# Patient Record
Sex: Female | Born: 1975 | Hispanic: No | Marital: Married | Smoking: Former smoker
Health system: Southern US, Community
[De-identification: ages and names within clinical notes are randomized; demographics above are authoritative.]

## PROBLEM LIST (undated history)

## (undated) DIAGNOSIS — F319 Bipolar disorder, unspecified: Secondary | ICD-10-CM

## (undated) DIAGNOSIS — F429 Obsessive-compulsive disorder, unspecified: Secondary | ICD-10-CM

## (undated) DIAGNOSIS — N939 Abnormal uterine and vaginal bleeding, unspecified: Secondary | ICD-10-CM

## (undated) DIAGNOSIS — L740 Miliaria rubra: Secondary | ICD-10-CM

## (undated) DIAGNOSIS — F909 Attention-deficit hyperactivity disorder, unspecified type: Secondary | ICD-10-CM

## (undated) DIAGNOSIS — R519 Headache, unspecified: Secondary | ICD-10-CM

## (undated) DIAGNOSIS — K219 Gastro-esophageal reflux disease without esophagitis: Secondary | ICD-10-CM

## (undated) HISTORY — PX: HEMORRHOID SURGERY: SHX153

---

## 2013-08-02 ENCOUNTER — Other Ambulatory Visit (HOSPITAL_COMMUNITY)
Admission: RE | Admit: 2013-08-02 | Discharge: 2013-08-02 | Disposition: A | Discharge status: Home / Self Care | Payer: 59 | Source: Ambulatory Visit | Attending: Internal Medicine | Admitting: Internal Medicine

## 2013-08-02 DIAGNOSIS — Z01419 Encounter for gynecological examination (general) (routine) without abnormal findings: Secondary | ICD-10-CM | POA: Insufficient documentation

## 2014-12-14 ENCOUNTER — Other Ambulatory Visit (HOSPITAL_COMMUNITY)
Admission: RE | Admit: 2014-12-14 | Discharge: 2014-12-14 | Disposition: A | Discharge status: Home / Self Care | Payer: Self-pay | Source: Ambulatory Visit | Attending: Internal Medicine | Admitting: Internal Medicine

## 2014-12-14 DIAGNOSIS — Z01419 Encounter for gynecological examination (general) (routine) without abnormal findings: Secondary | ICD-10-CM | POA: Insufficient documentation

## 2015-12-23 ENCOUNTER — Emergency Department (HOSPITAL_COMMUNITY): Payer: Managed Care, Other (non HMO)

## 2015-12-23 ENCOUNTER — Encounter (HOSPITAL_COMMUNITY)
Admission: EM | Disposition: A | Discharge status: Home / Self Care | Payer: Self-pay | Source: Home / Self Care | Attending: Emergency Medicine

## 2015-12-23 ENCOUNTER — Encounter (HOSPITAL_COMMUNITY): Payer: Self-pay | Admitting: Emergency Medicine

## 2015-12-23 ENCOUNTER — Observation Stay (HOSPITAL_COMMUNITY)
Admission: EM | Admit: 2015-12-23 | Discharge: 2015-12-25 | Disposition: A | Discharge status: Home / Self Care | Payer: Managed Care, Other (non HMO) | Attending: Surgery | Admitting: Surgery

## 2015-12-23 ENCOUNTER — Observation Stay (HOSPITAL_COMMUNITY): Payer: Managed Care, Other (non HMO) | Admitting: Certified Registered"

## 2015-12-23 DIAGNOSIS — K3580 Unspecified acute appendicitis: Secondary | ICD-10-CM

## 2015-12-23 DIAGNOSIS — Z79899 Other long term (current) drug therapy: Secondary | ICD-10-CM | POA: Diagnosis not present

## 2015-12-23 DIAGNOSIS — F319 Bipolar disorder, unspecified: Secondary | ICD-10-CM | POA: Diagnosis not present

## 2015-12-23 DIAGNOSIS — K353 Acute appendicitis with localized peritonitis: Principal | ICD-10-CM | POA: Insufficient documentation

## 2015-12-23 DIAGNOSIS — Z87891 Personal history of nicotine dependence: Secondary | ICD-10-CM | POA: Diagnosis not present

## 2015-12-23 DIAGNOSIS — K37 Unspecified appendicitis: Secondary | ICD-10-CM | POA: Diagnosis present

## 2015-12-23 DIAGNOSIS — R1031 Right lower quadrant pain: Secondary | ICD-10-CM | POA: Diagnosis present

## 2015-12-23 HISTORY — PX: LAPAROSCOPIC APPENDECTOMY: SHX408

## 2015-12-23 HISTORY — DX: Bipolar disorder, unspecified: F31.9

## 2015-12-23 LAB — COMPREHENSIVE METABOLIC PANEL
ALBUMIN: 4.9 g/dL (ref 3.5–5.0)
ALK PHOS: 73 U/L (ref 38–126)
ALT: 20 U/L (ref 14–54)
ANION GAP: 11 (ref 5–15)
AST: 21 U/L (ref 15–41)
BILIRUBIN TOTAL: 0.5 mg/dL (ref 0.3–1.2)
BUN: 8 mg/dL (ref 6–20)
CALCIUM: 9.4 mg/dL (ref 8.9–10.3)
CO2: 24 mmol/L (ref 22–32)
CREATININE: 0.89 mg/dL (ref 0.44–1.00)
Chloride: 104 mmol/L (ref 101–111)
GFR calc Af Amer: >60 mL/min (ref >60)
GFR calc non Af Amer: >60 mL/min (ref >60)
GLUCOSE: 157 mg/dL — AB (ref 65–99)
Potassium: 3.9 mmol/L (ref 3.5–5.1)
SODIUM: 139 mmol/L (ref 135–145)
TOTAL PROTEIN: 8.2 g/dL — AB (ref 6.5–8.1)

## 2015-12-23 LAB — URINALYSIS, ROUTINE W REFLEX MICROSCOPIC
BILIRUBIN URINE: NEGATIVE
Glucose, UA: NEGATIVE mg/dL
Leukocytes, UA: NEGATIVE
NITRITE: NEGATIVE
Protein, ur: NEGATIVE mg/dL
SPECIFIC GRAVITY, URINE: 1.029 (ref 1.005–1.030)
pH: 6 (ref 5.0–8.0)

## 2015-12-23 LAB — CBC
HCT: 42 % (ref 36.0–46.0)
Hemoglobin: 13.8 g/dL (ref 12.0–15.0)
MCH: 31 pg (ref 26.0–34.0)
MCHC: 32.9 g/dL (ref 30.0–36.0)
MCV: 94.4 fL (ref 78.0–100.0)
Platelets: 303 10*3/uL (ref 150–400)
RBC: 4.45 MIL/uL (ref 3.87–5.11)
RDW: 13.5 % (ref 11.5–15.5)
WBC: 14.8 10*3/uL — ABNORMAL HIGH (ref 4.0–10.5)

## 2015-12-23 LAB — I-STAT BETA HCG BLOOD, ED (MC, WL, AP ONLY)

## 2015-12-23 LAB — URINE MICROSCOPIC-ADD ON

## 2015-12-23 LAB — LIPASE, BLOOD: Lipase: 23 U/L (ref 11–51)

## 2015-12-23 SURGERY — APPENDECTOMY, LAPAROSCOPIC
Anesthesia: General | Site: Abdomen

## 2015-12-23 MED ORDER — MIDAZOLAM HCL 5 MG/5ML IJ SOLN
INTRAMUSCULAR | Status: DC | PRN
  Administered 2015-12-23: 2 mg via INTRAVENOUS

## 2015-12-23 MED ORDER — HEPARIN SODIUM (PORCINE) 5000 UNIT/ML IJ SOLN
5000.0000 [IU] | Freq: Three times a day (TID) | INTRAMUSCULAR | Status: DC
  Administered 2015-12-24 – 2015-12-25 (×4): 5000 [IU] via SUBCUTANEOUS
  Filled 2015-12-23 (×7): qty 1

## 2015-12-23 MED ORDER — PROPOFOL 10 MG/ML IV BOLUS
INTRAVENOUS | Status: DC | PRN
Start: 2015-12-23 — End: 2015-12-23
  Administered 2015-12-23: 200 mg via INTRAVENOUS

## 2015-12-23 MED ORDER — BUPIVACAINE-EPINEPHRINE 0.25% -1:200000 IJ SOLN
INTRAMUSCULAR | Status: DC | PRN
  Administered 2015-12-23: 20 mL

## 2015-12-23 MED ORDER — FENTANYL CITRATE (PF) 250 MCG/5ML IJ SOLN
INTRAMUSCULAR | Status: AC
  Filled 2015-12-23: qty 5

## 2015-12-23 MED ORDER — NEOSTIGMINE METHYLSULFATE 10 MG/10ML IV SOLN
INTRAVENOUS | Status: DC | PRN
  Administered 2015-12-23: 3 mg via INTRAVENOUS

## 2015-12-23 MED ORDER — 0.9 % SODIUM CHLORIDE (POUR BTL) OPTIME
TOPICAL | Status: DC | PRN
  Administered 2015-12-23: 1000 mL

## 2015-12-23 MED ORDER — LACTATED RINGERS IV SOLN: INTRAVENOUS | Status: DC

## 2015-12-23 MED ORDER — ROCURONIUM BROMIDE 100 MG/10ML IV SOLN
INTRAVENOUS | Status: AC
  Filled 2015-12-23: qty 1

## 2015-12-23 MED ORDER — GLYCOPYRROLATE 0.2 MG/ML IJ SOLN
INTRAMUSCULAR | Status: AC
  Filled 2015-12-23: qty 2

## 2015-12-23 MED ORDER — MORPHINE SULFATE (PF) 2 MG/ML IV SOLN
1.0000 mg | INTRAVENOUS | Status: DC | PRN
  Administered 2015-12-23 (×2): 4 mg via INTRAVENOUS
  Filled 2015-12-23 (×2): qty 2

## 2015-12-23 MED ORDER — ROCURONIUM BROMIDE 100 MG/10ML IV SOLN
INTRAVENOUS | Status: DC | PRN
  Administered 2015-12-23: 40 mg via INTRAVENOUS

## 2015-12-23 MED ORDER — ONDANSETRON HCL 4 MG/2ML IJ SOLN
4.0000 mg | Freq: Once | INTRAMUSCULAR | Status: AC
  Administered 2015-12-23: 4 mg via INTRAVENOUS
  Filled 2015-12-23: qty 2

## 2015-12-23 MED ORDER — MORPHINE SULFATE (PF) 4 MG/ML IV SOLN
4.0000 mg | Freq: Once | INTRAVENOUS | Status: AC
  Administered 2015-12-23: 4 mg via INTRAVENOUS
  Filled 2015-12-23: qty 1

## 2015-12-23 MED ORDER — LACTATED RINGERS IR SOLN
Status: DC | PRN
  Administered 2015-12-23: 3000 mL

## 2015-12-23 MED ORDER — INFLUENZA VAC SPLIT QUAD 0.5 ML IM SUSY
0.5000 mL | PREFILLED_SYRINGE | INTRAMUSCULAR | Status: DC
  Filled 2015-12-23 (×2): qty 0.5

## 2015-12-23 MED ORDER — IBUPROFEN 200 MG PO TABS
600.0000 mg | ORAL_TABLET | Freq: Four times a day (QID) | ORAL | Status: DC | PRN
  Administered 2015-12-25: 600 mg via ORAL
  Filled 2015-12-23: qty 3

## 2015-12-23 MED ORDER — DEXAMETHASONE SODIUM PHOSPHATE 10 MG/ML IJ SOLN
INTRAMUSCULAR | Status: AC
Start: 2015-12-23 — End: 2015-12-23
  Filled 2015-12-23: qty 1

## 2015-12-23 MED ORDER — DEXTROSE 5 % IV SOLN
2.0000 g | INTRAVENOUS | Status: DC
  Administered 2015-12-23: 2 g via INTRAVENOUS
  Filled 2015-12-23: qty 2

## 2015-12-23 MED ORDER — PROMETHAZINE HCL 25 MG/ML IJ SOLN: 6.2500 mg | INTRAMUSCULAR | Status: DC | PRN

## 2015-12-23 MED ORDER — HYDROMORPHONE HCL 1 MG/ML IJ SOLN: 0.2500 mg | INTRAMUSCULAR | Status: DC | PRN

## 2015-12-23 MED ORDER — MIDAZOLAM HCL 2 MG/2ML IJ SOLN
INTRAMUSCULAR | Status: AC
  Filled 2015-12-23: qty 2

## 2015-12-23 MED ORDER — KCL IN DEXTROSE-NACL 20-5-0.45 MEQ/L-%-% IV SOLN
INTRAVENOUS | Status: DC
  Administered 2015-12-24: via INTRAVENOUS
  Filled 2015-12-23 (×3): qty 1000

## 2015-12-23 MED ORDER — KCL IN DEXTROSE-NACL 20-5-0.9 MEQ/L-%-% IV SOLN: INTRAVENOUS | Status: DC

## 2015-12-23 MED ORDER — QUETIAPINE FUMARATE ER 300 MG PO TB24
300.0000 mg | ORAL_TABLET | Freq: Every day | ORAL | Status: DC
  Administered 2015-12-24 (×2): 300 mg via ORAL
  Filled 2015-12-23 (×3): qty 1

## 2015-12-23 MED ORDER — METRONIDAZOLE IN NACL 5-0.79 MG/ML-% IV SOLN
500.0000 mg | Freq: Three times a day (TID) | INTRAVENOUS | Status: DC
  Administered 2015-12-23 – 2015-12-24 (×2): 500 mg via INTRAVENOUS
  Filled 2015-12-23 (×3): qty 100

## 2015-12-23 MED ORDER — IOHEXOL 300 MG/ML  SOLN
25.0000 mL | Freq: Once | INTRAMUSCULAR | Status: AC | PRN
  Administered 2015-12-23: 25 mL via ORAL

## 2015-12-23 MED ORDER — ONDANSETRON HCL 4 MG/2ML IJ SOLN
INTRAMUSCULAR | Status: DC | PRN
  Administered 2015-12-23: 4 mg via INTRAVENOUS

## 2015-12-23 MED ORDER — PHENYLEPHRINE 40 MCG/ML (10ML) SYRINGE FOR IV PUSH (FOR BLOOD PRESSURE SUPPORT)
PREFILLED_SYRINGE | INTRAVENOUS | Status: AC
  Filled 2015-12-23: qty 10

## 2015-12-23 MED ORDER — IOHEXOL 300 MG/ML  SOLN
100.0000 mL | Freq: Once | INTRAMUSCULAR | Status: AC | PRN
  Administered 2015-12-23: 100 mL via INTRAVENOUS

## 2015-12-23 MED ORDER — BUPROPION HCL ER (XL) 300 MG PO TB24
300.0000 mg | ORAL_TABLET | Freq: Every morning | ORAL | Status: DC
  Administered 2015-12-24 – 2015-12-25 (×2): 300 mg via ORAL
  Filled 2015-12-23 (×2): qty 1

## 2015-12-23 MED ORDER — HYDROCODONE-ACETAMINOPHEN 5-325 MG PO TABS
1.0000 | ORAL_TABLET | ORAL | Status: DC | PRN
  Administered 2015-12-24 (×2): 2 via ORAL
  Administered 2015-12-24: 1 via ORAL
  Administered 2015-12-24: 2 via ORAL
  Administered 2015-12-25: 1 via ORAL
  Filled 2015-12-23 (×3): qty 2
  Filled 2015-12-23 (×2): qty 1

## 2015-12-23 MED ORDER — ONDANSETRON 4 MG PO TBDP: 4.0000 mg | ORAL_TABLET | Freq: Four times a day (QID) | ORAL | Status: DC | PRN

## 2015-12-23 MED ORDER — FLUOXETINE HCL 20 MG PO CAPS
20.0000 mg | ORAL_CAPSULE | Freq: Every morning | ORAL | Status: DC
  Administered 2015-12-24 – 2015-12-25 (×2): 20 mg via ORAL
  Filled 2015-12-23 (×2): qty 1

## 2015-12-23 MED ORDER — ONDANSETRON HCL 4 MG/2ML IJ SOLN
INTRAMUSCULAR | Status: AC
  Filled 2015-12-23: qty 2

## 2015-12-23 MED ORDER — PROPOFOL 10 MG/ML IV BOLUS
INTRAVENOUS | Status: AC
  Filled 2015-12-23: qty 20

## 2015-12-23 MED ORDER — NEOSTIGMINE METHYLSULFATE 10 MG/10ML IV SOLN
INTRAVENOUS | Status: AC
  Filled 2015-12-23: qty 1

## 2015-12-23 MED ORDER — LIDOCAINE HCL (PF) 2 % IJ SOLN
INTRAMUSCULAR | Status: DC | PRN
  Administered 2015-12-23: 30 mg via INTRADERMAL

## 2015-12-23 MED ORDER — GLYCOPYRROLATE 0.2 MG/ML IJ SOLN
INTRAMUSCULAR | Status: DC | PRN
  Administered 2015-12-23: 0.4 mg via INTRAVENOUS

## 2015-12-23 MED ORDER — BUPIVACAINE-EPINEPHRINE (PF) 0.25% -1:200000 IJ SOLN
INTRAMUSCULAR | Status: AC
  Filled 2015-12-23: qty 30

## 2015-12-23 MED ORDER — DEXAMETHASONE SODIUM PHOSPHATE 10 MG/ML IJ SOLN
INTRAMUSCULAR | Status: DC | PRN
  Administered 2015-12-23: 10 mg via INTRAVENOUS

## 2015-12-23 MED ORDER — ONDANSETRON HCL 4 MG/2ML IJ SOLN
4.0000 mg | Freq: Four times a day (QID) | INTRAMUSCULAR | Status: DC | PRN
  Administered 2015-12-23: 4 mg via INTRAVENOUS
  Filled 2015-12-23: qty 2

## 2015-12-23 MED ORDER — SODIUM CHLORIDE 0.9 % IV BOLUS (SEPSIS)
1000.0000 mL | Freq: Once | INTRAVENOUS | Status: AC
  Administered 2015-12-23: 1000 mL via INTRAVENOUS

## 2015-12-23 MED ORDER — MORPHINE SULFATE (PF) 2 MG/ML IV SOLN
1.0000 mg | INTRAVENOUS | Status: DC | PRN
  Administered 2015-12-24: 2 mg via INTRAVENOUS
  Administered 2015-12-24: 1 mg via INTRAVENOUS
  Administered 2015-12-24: 2 mg via INTRAVENOUS
  Filled 2015-12-23 (×3): qty 1

## 2015-12-23 MED ORDER — ONDANSETRON HCL 4 MG/2ML IJ SOLN: 4.0000 mg | Freq: Four times a day (QID) | INTRAMUSCULAR | Status: DC | PRN

## 2015-12-23 MED ORDER — LACTATED RINGERS IV SOLN
INTRAVENOUS | Status: DC | PRN
  Administered 2015-12-23: 21:00:00 via INTRAVENOUS

## 2015-12-23 MED ORDER — FENTANYL CITRATE (PF) 100 MCG/2ML IJ SOLN
INTRAMUSCULAR | Status: DC | PRN
  Administered 2015-12-23 (×5): 50 ug via INTRAVENOUS

## 2015-12-23 MED ORDER — SUCCINYLCHOLINE CHLORIDE 20 MG/ML IJ SOLN
INTRAMUSCULAR | Status: DC | PRN
  Administered 2015-12-23: 100 mg via INTRAVENOUS

## 2015-12-23 MED ORDER — PHENYLEPHRINE HCL 10 MG/ML IJ SOLN
INTRAMUSCULAR | Status: DC | PRN
  Administered 2015-12-23: 80 ug via INTRAVENOUS

## 2015-12-23 MED ORDER — AMPHETAMINE-DEXTROAMPHETAMINE 10 MG PO TABS
20.0000 mg | ORAL_TABLET | Freq: Every morning | ORAL | Status: DC
Start: 2015-12-24 — End: 2015-12-25

## 2015-12-23 MED ORDER — PIPERACILLIN-TAZOBACTAM 3.375 G IVPB
3.3750 g | Freq: Three times a day (TID) | INTRAVENOUS | Status: DC
  Administered 2015-12-24 (×2): 3.375 g via INTRAVENOUS
  Filled 2015-12-23 (×3): qty 50

## 2015-12-23 SURGICAL SUPPLY — 34 items
APPLIER CLIP ROT 10 11.4 M/L (STAPLE)
BENZOIN TINCTURE PRP APPL 2/3 (GAUZE/BANDAGES/DRESSINGS) IMPLANT
CABLE HIGH FREQUENCY MONO STRZ (ELECTRODE) ×3 IMPLANT
CHLORAPREP W/TINT 26ML (MISCELLANEOUS) ×3 IMPLANT
CLIP APPLIE ROT 10 11.4 M/L (STAPLE) IMPLANT
CLOSURE WOUND 1/2 X4 (GAUZE/BANDAGES/DRESSINGS)
COVER SURGICAL LIGHT HANDLE (MISCELLANEOUS) ×3 IMPLANT
CUTTER FLEX LINEAR 45M (STAPLE) ×3 IMPLANT
DECANTER SPIKE VIAL GLASS SM (MISCELLANEOUS) ×3 IMPLANT
DRAPE LAPAROSCOPIC ABDOMINAL (DRAPES) ×3 IMPLANT
ELECT REM PT RETURN 9FT ADLT (ELECTROSURGICAL) ×3
ELECTRODE REM PT RTRN 9FT ADLT (ELECTROSURGICAL) ×1 IMPLANT
ENDOLOOP SUT PDS II  0 18 (SUTURE)
ENDOLOOP SUT PDS II 0 18 (SUTURE) IMPLANT
GLOVE SURG SIGNA 7.5 PF LTX (GLOVE) ×3 IMPLANT
GOWN STRL REUS W/TWL XL LVL3 (GOWN DISPOSABLE) ×6 IMPLANT
KIT BASIN OR (CUSTOM PROCEDURE TRAY) ×3 IMPLANT
LIQUID BAND (GAUZE/BANDAGES/DRESSINGS) ×3 IMPLANT
POUCH SPECIMEN RETRIEVAL 10MM (ENDOMECHANICALS) ×3 IMPLANT
RELOAD 45 VASCULAR/THIN (ENDOMECHANICALS) ×3 IMPLANT
RELOAD STAPLE TA45 3.5 REG BLU (ENDOMECHANICALS) ×3 IMPLANT
SCISSORS LAP 5X35 DISP (ENDOMECHANICALS) ×3 IMPLANT
SET IRRIG TUBING LAPAROSCOPIC (IRRIGATION / IRRIGATOR) ×3 IMPLANT
SHEARS HARMONIC ACE PLUS 36CM (ENDOMECHANICALS) ×3 IMPLANT
SLEEVE XCEL OPT CAN 5 100 (ENDOMECHANICALS) ×3 IMPLANT
STRIP CLOSURE SKIN 1/2X4 (GAUZE/BANDAGES/DRESSINGS) IMPLANT
SUT MNCRL AB 4-0 PS2 18 (SUTURE) ×3 IMPLANT
SUT VIC AB 2-0 SH 18 (SUTURE) IMPLANT
TOWEL OR 17X26 10 PK STRL BLUE (TOWEL DISPOSABLE) ×3 IMPLANT
TOWEL OR NON WOVEN STRL DISP B (DISPOSABLE) ×3 IMPLANT
TRAY FOLEY W/METER SILVER 14FR (SET/KITS/TRAYS/PACK) ×3 IMPLANT
TRAY LAPAROSCOPIC (CUSTOM PROCEDURE TRAY) ×3 IMPLANT
TROCAR BLADELESS OPT 5 100 (ENDOMECHANICALS) ×3 IMPLANT
TROCAR XCEL BLUNT TIP 100MML (ENDOMECHANICALS) ×3 IMPLANT

## 2015-12-23 NOTE — Anesthesia Preprocedure Evaluation (Addendum)
Anesthesia Evaluation  Patient identified by MRN, date of birth, ID band Patient awake    Reviewed: Allergy & Precautions, NPO status , Patient's Chart, lab work & pertinent test results  Airway Mallampati: II  TM Distance: >3 FB Neck ROM: Full    Dental   Pulmonary former smoker,    breath sounds clear to auscultation       Cardiovascular negative cardio ROS   Rhythm:Regular Rate:Normal     Neuro/Psych Bipolar Disorder    GI/Hepatic negative GI ROS, Neg liver ROS,   Endo/Other  negative endocrine ROS  Renal/GU negative Renal ROS     Musculoskeletal   Abdominal   Peds  Hematology   Anesthesia Other Findings   Reproductive/Obstetrics                           Anesthesia Physical Anesthesia Plan  ASA: III and emergent  Anesthesia Plan: General   Post-op Pain Management:    Induction: Intravenous, Rapid sequence and Cricoid pressure planned  Airway Management Planned: Oral ETT  Additional Equipment:   Intra-op Plan:   Post-operative Plan: Extubation in OR  Informed Consent: I have reviewed the patients History and Physical, chart, labs and discussed the procedure including the risks, benefits and alternatives for the proposed anesthesia with the patient or authorized representative who has indicated his/her understanding and acceptance.   Dental advisory given  Plan Discussed with: CRNA and Anesthesiologist  Anesthesia Plan Comments:        Anesthesia Quick Evaluation

## 2015-12-23 NOTE — Anesthesia Postprocedure Evaluation (Signed)
Anesthesia Post Note  Patient: Lydia [name]  Procedure(s) Performed: Procedure(s) (LRB): APPENDECTOMY LAPAROSCOPIC (N/A)  Patient location during evaluation: PACU Anesthesia Type: General Level of consciousness: awake Pain management: pain level controlled Vital Signs Assessment: post-procedure vital signs reviewed and stable Respiratory status: spontaneous breathing Cardiovascular status: stable Anesthetic complications: no    Last Vitals:  Filed Vitals:   12/23/15 2245 12/23/15 2300  BP: 117/70 125/73  Pulse: 92 102  Temp: 36.9 C 36.9 C  Resp: 15 20    Last Pain:  Filed Vitals:   12/23/15 2302  PainSc: 2                  EDWARDS,Finneas Mathe

## 2015-12-23 NOTE — Transfer of Care (Signed)
Immediate Anesthesia Transfer of Care Note  Patient: Lydia [name]  Procedure(s) Performed: Procedure(s): APPENDECTOMY LAPAROSCOPIC (N/A)  Patient Location: PACU  Anesthesia Type:General  Level of Consciousness: awake, alert  and oriented  Airway & Oxygen Therapy: Patient Spontanous Breathing and Patient connected to face mask oxygen  Post-op Assessment: Report given to RN and Post -op Vital signs reviewed and stable  Post vital signs: Reviewed and stable  Last Vitals:  Filed Vitals:   12/23/15 1914 12/23/15 1937  BP: 110/87 128/79  Pulse: 84 84  Temp: 37.2 C 37.3 C  Resp: 16 16    Complications: No apparent anesthesia complications

## 2015-12-23 NOTE — ED Notes (Signed)
Awake. Verbally responsive. A/O x4. Resp even and unlabored. No audible adventitious breath sounds noted. ABC's intact. IV infusing NS at 258ml/hr without difficulty per Dr. Lucia Gaskins.

## 2015-12-23 NOTE — ED Notes (Addendum)
Pt c/o right abdominal pain, nausea, emesis onset this morning. No rebound tenderness.

## 2015-12-23 NOTE — ED Provider Notes (Signed)
CSN: [id number]     Arrival date & time 12/23/15  1202 History   First MD Initiated Contact with Patient 12/23/15 1505     No chief complaint on file.   HPI   40 year old female presents today with right lower quadrant pain. Patient reports that around 1 AM this morning she started to develop pain in her right upper quadrant that localized to her right lower quadrant. She describes it as a "pulsing/stabbing pain". She reports associated nausea and vomiting, denies any diarrhea. She reports that she was able to eat last night, but has been unable to tolerate food this morning. She reports normal bowel movement this morning. Additionally she notes odorous dark-colored urine, no difficulty with urination. Patient denies any fever, chills, left-sided abdominal pain, lower extremity swelling or edema. Patient denies any upper respiratory complaints. She denies any recent antibiotic exposure. Surgical history includes hemorrhoidectomy 8 years prior. Patient does not have any chronic health conditions other than bipolar disease.   Past Medical History  Diagnosis Date  . Bipolar disorder ([place])    History reviewed. No pertinent past surgical history. No family history on file. Social History  Substance Use Topics  . Smoking status: Former Research scientist (life sciences)  . Smokeless tobacco: None  . Alcohol Use: No   OB History    No data available     Review of Systems  All other systems reviewed and are negative.   Allergies  Other  Home Medications   Prior to Admission medications   Medication Sig Start Date End Date Taking? Authorizing Provider  ALPRAZolam Duanne Moron) 0.5 MG tablet Take 0.5 mg by mouth at bedtime as needed for anxiety or sleep.    Yes Historical Provider, MD  amphetamine-dextroamphetamine (ADDERALL) 20 MG tablet Take 20 mg by mouth every morning.  12/11/15  Yes Historical Provider, MD  buPROPion (WELLBUTRIN XL) 300 MG 24 hr tablet Take 300 mg by mouth every morning.  09/17/15  Yes Historical  Provider, MD  cholecalciferol (VITAMIN D) 1000 units tablet Take 2,000-4,000 Units by mouth daily. Take 2000 units, alternating with 4000 units once daily.   Yes Historical Provider, MD  FLUoxetine (PROZAC) 20 MG capsule Take 20 mg by mouth every morning.  09/17/15  Yes Historical Provider, MD  IRON PO Take 1-2 tablets by mouth every morning. Take 1 tablet, alternating with 2 tablets once daily.   Yes Historical Provider, MD  Multiple Vitamin (MULTIVITAMIN WITH MINERALS) TABS tablet Take 1 tablet by mouth daily.   Yes Historical Provider, MD  Probiotic Product (PROBIOTIC PO) Take 1 capsule by mouth every morning.   Yes Historical Provider, MD  QUEtiapine (SEROQUEL XR) 300 MG 24 hr tablet Take 300 mg by mouth at bedtime.  11/13/15  Yes Historical Provider, MD  SUMAtriptan (IMITREX) 25 MG tablet Take 25 mg by mouth See admin instructions. Take 25 mg at onset of migraine.  May repeat in 2 hours if needed. 12/19/15  Yes Historical Provider, MD  Wheat Dextrin (BENEFIBER) POWD Take 2 Units by mouth See admin instructions. Take 2 tablespoons once daily.   Yes Historical Provider, MD   BP 125/86 mmHg  Pulse 88  Temp(Src) 98.1 F (36.7 C) (Oral)  Resp 15  SpO2 100%  LMP 11/25/2015   Physical Exam  Constitutional: She is oriented to person, place, and time. She appears well-developed and well-nourished.  HENT:  Head: Normocephalic and atraumatic.  Eyes: Conjunctivae are normal. Pupils are equal, round, and reactive to light. Right eye exhibits no discharge. Left  eye exhibits no discharge. No scleral icterus.  Neck: Normal range of motion. No JVD present. No tracheal deviation present.  Cardiovascular: Normal rate, regular rhythm, normal heart sounds and intact distal pulses.  Exam reveals no gallop and no friction rub.   No murmur heard. Pulmonary/Chest: Effort normal and breath sounds normal. No stridor. No respiratory distress. She has no wheezes. She has no rales. She exhibits no tenderness.    Abdominal: Soft. She exhibits no distension and no mass. There is tenderness. There is no rebound and no guarding.    Musculoskeletal: Normal range of motion. She exhibits no edema or tenderness.  Neurological: She is alert and oriented to person, place, and time. Coordination normal.  Skin: Skin is warm and dry. No rash noted. No erythema.  Psychiatric: She has a normal mood and affect. Her behavior is normal. Judgment and thought content normal.  Nursing note and vitals reviewed.   ED Course  Procedures (including critical care time) Labs Review Labs Reviewed  COMPREHENSIVE METABOLIC PANEL - Abnormal; Notable for the following:    Glucose, Bld 157 (*)    Total Protein 8.2 (*)    All other components within normal limits  CBC - Abnormal; Notable for the following:    WBC 14.8 (*)    All other components within normal limits  URINALYSIS, ROUTINE W REFLEX MICROSCOPIC (NOT AT [facility]) - Abnormal; Notable for the following:    Hgb urine dipstick SMALL (*)    Ketones, ur >80 (*)    All other components within normal limits  URINE MICROSCOPIC-ADD ON - Abnormal; Notable for the following:    Squamous Epithelial / LPF 0-5 (*)    Bacteria, UA FEW (*)    All other components within normal limits  LIPASE, BLOOD  I-STAT BETA HCG BLOOD, ED ([facility], [facility], [facility] ONLY)    Imaging Review Ct Abdomen Pelvis W Contrast  12/23/2015  CLINICAL DATA:  Right lower quadrant abdominal pain radiating to the right leg and groin area. Nausea and vomiting and diarrhea over the past 24 hours. EXAM: CT ABDOMEN AND PELVIS WITH CONTRAST TECHNIQUE: Multidetector CT imaging of the abdomen and pelvis was performed using the standard protocol following bolus administration of intravenous contrast. CONTRAST:  165mL OMNIPAQUE IOHEXOL 300 MG/ML  SOLN COMPARISON:  03/06/2013 lumbar spine MRI FINDINGS: Lower chest: Linear subsegmental atelectasis in the posterior basal segments of both lower lobes. Hepatobiliary: Unremarkable  Pancreas: Borderline prominent dorsal pancreatic duct, 3 mm diameter in the pancreatic head. Spleen: Unremarkable Adrenals/Urinary Tract: Unremarkable Stomach/Bowel: Acute appendicitis observed, with the appendix measuring 1.6 cm in diameter, with periappendiceal stranding, and with multiple small appendicoliths. No abscess or extraluminal gas in this vicinity. The appendix sits just below the cecum. There is some scattered air-fluid levels in nondilated small bowel likely from ileus. Vascular/Lymphatic: Unremarkable Reproductive: Unremarkable Other: No supplemental non-categorized findings. Musculoskeletal: Unremarkable IMPRESSION: 1. Acute nonruptured appendicitis. Appendiceal diameter 1.6 cm, periappendiceal stranding observed. These results will be called to the ordering clinician or representative by the Radiologist Assistant, and communication documented in the PACS or zVision Dashboard. Electronically Signed   By: Van Clines M.D.   On: 12/23/2015 17:03   I have personally reviewed and evaluated these images and lab results as part of my medical decision-making.   EKG Interpretation None      MDM   Final diagnoses:  Acute appendicitis, unspecified acute appendicitis type    Labs: CBC, CMP Lipase, HCG- WBC 14.8  Imaging: CT and pelvis  Consults:  Therapeutics:morphine, zofran, NS  Discharge Meds:   Assessment/Plan: 70-year-old female presents today with acute nonruptured appendicitis. She is afebrile, with reassuring vital signs. Patient treated here with antibiotics, morphine, normal saline. Consult at general surgery spoke with Dr. Lucia Gaskins who would do down to evaluate the patient in the emergency room.        Okey Regal, PA-C 12/23/15 1744  Leo Grosser, MD 12/25/15 [id number]

## 2015-12-23 NOTE — ED Notes (Signed)
Awake. Verbally responsive. A/O x4. Resp even and unlabored. No audible adventitious breath sounds noted. ABC's intact. IV saline lock patent and intact. Family at bedside. 

## 2015-12-23 NOTE — Op Note (Signed)
Re:   Lydia [name] DOB:   [id number] MRN:   [id number]                   FACILITY:  [facility]  DATE OF PROCEDURE: 12/23/2015                              OPERATIVE REPORT  PREOPERATIVE DIAGNOSIS:  Appendicitis  POSTOPERATIVE DIAGNOSIS:  Infarcted purulent appendicitis.  PROCEDURE:  Laparoscopic appendectomy.  (photos in paper chart)  SURGEON:  Fenton Malling. Lucia Gaskins, MD  ASSISTANT:  No first assistant.  ANESTHESIA:  General endotracheal.  Anesthesiologist: Finis Bud, MD CRNA: Verlin Grills, CRNA; Sharlette Dense, CRNA  ASA:  2E  ESTIMATED BLOOD LOSS:  Minimal.  DRAINS: none   SPECIMEN:   Appendix  COUNTS CORRECT:  YES  INDICATIONS FOR PROCEDURE: Lydia [name] is a 40 y.o. (DOB: [birth year]) whtie  female whose primary care doctor is No primary care provider on file. and comes to the OR for an appendectomy.   I discussed with the patient, the indications and potential complications of appendiceal surgery.  The potential complications include, but are not limited to, bleeding, open surgery, bowel resection, and the possibility of another diagnosis.  OPERATIVE NOTE:  The patient underwent a general endotracheal anesthetic as supervised by Anesthesiologist: Finis Bud, MD CRNA: Verlin Grills, CRNA; Sharlette Dense, CRNA, General, in room #1.  The patient was Rocephin and Flagyl prior the beginning of the procedure and the abdomen was prepped with ChloraPrep.  I did not place a foley.  A time-out was held and surgical checklist run.  An infraumbilical incision was made with sharp dissection carried down to the abdominal cavity.  An 12 mm Hasson trocar was inserted through the infraumbilical incision and into the peritoneal cavity.  A 0 degree 10 mm laparoscope was inserted through a 12 mm Hasson trocar and the Hasson trocar secured with a 0 Vicryl suture.  I placed a 5 mm trocar in the right upper quadrant and 5 mm torcar in left lower quadrant and did abdominal exploration.    The  right and left lobes of liver unremarkable.  Stomach was unremarkable.  The pelvic organs were unremarkable.  She had purulence around the appendix and in the pelvis.  The patient had appendicitis with the appendix located right pelvic brim.  The right colon had attachments to the anterior peritoneum that seemed unrelated to the appendicitis.  The appendix was infarcted and purulent.  The mesentery of the appendix was divided with a Harmonic scalpel.  I got to the base of the appendix.  I then used a blue load 45 mm Ethicon Endo-GIA stapler and fired this across the base of the appendix.  I placed the appendix in EndoCatch bag and delivered the bag through the umbilical incision.  I irrigated the abdomen with 3,000 cc of saline.  After irrigating the abdomen, I then removed the trocars, in turn.  The umbilical port fascia was closed with 0 Vicryl suture.   I closed the skin each site with a 4-0 Monocryl suture and painted the wounds with LiquidBand.  I then injected a total of 30 mL of 0.25% Marcaine at the incisions.  Sponge and needle count were correct at the end of the case.  The foley catheter was removed in the OR.  The patient was transferred to the recovery room in good condition.  The patient tolerated the procedure well  and it depends on the patient's post op clinical course as to when the patient could be discharged.   Alphonsa Overall, MD, [facility] Surgery Pager: [phone] Office phone:  [phone]

## 2015-12-23 NOTE — H&P (Addendum)
Re:   Lydia [name] DOB:   [birth year] MRN:   [id number]   [facility] Admission  ASSESSMENT AND PLAN: 1.  Acute appendicits  I discussed with the patient the indications and risks of appendiceal surgery.  The primary risks of appendiceal surgery include, but are not limited to, bleeding, infection, bowel surgery, and open surgery.  There is also the risk that the patient may have continued symptoms after surgery.  We discussed the typical post-operative recovery course. I tried to answer the patient's questions.  2.  Bipolar disease  Seen by Dr. Toy Cookey  No chief complaint on file.  REFERRING PHYSICIAN: Okey Regal, PA, Karenann Cai  HISTORY OF PRESENT ILLNESS: Lydia [name] is a 40 y.o. (DOB: May 13, 1976)  white  female whose primary care physician is No primary care provider on file. (She was seeing Dr. Irving Copas comes to the [facility] ER today for abdominal pain.  Her husband is in the room with her.   She has had no prior GI problems or complaints.  She awoke about 1AM today with worsening abdominal pain.  It started in her epigastrium, but migrated to her RLQ.  She has vomited about 4 to 6 times.  No diarrhea.   She came to the ER because of worsening abdominal pain.  She has no history of stomach disease.  No history of liver disease.  No history of gall bladder disease.  No history of pancreas disease.    She did have a hemorrhoidectomy about 2009.  She has had no prior abdominal surgery.  CT abdomen/pelvis - 12/23/2015 - Acute nonruptured appendicitis. Appendiceal diameter 1.6 cm, periappendiceal stranding observed. WBC - 12/23/2015 - 14,800    Past Medical History  Diagnosis Date  . Bipolar disorder ([place])      History reviewed. No pertinent past surgical history.    No current facility-administered medications for this encounter.   Current Outpatient Prescriptions  Medication Sig Dispense Refill  . ALPRAZolam (XANAX) 0.5 MG tablet Take 0.5 mg by mouth at bedtime as needed for anxiety or sleep.       Marland Kitchen amphetamine-dextroamphetamine (ADDERALL) 20 MG tablet Take 20 mg by mouth every morning.     Marland Kitchen buPROPion (WELLBUTRIN XL) 300 MG 24 hr tablet Take 300 mg by mouth every morning.     . cholecalciferol (VITAMIN D) 1000 units tablet Take 2,000-4,000 Units by mouth daily. Take 2000 units, alternating with 4000 units once daily.    Marland Kitchen FLUoxetine (PROZAC) 20 MG capsule Take 20 mg by mouth every morning.     . IRON PO Take 1-2 tablets by mouth every morning. Take 1 tablet, alternating with 2 tablets once daily.    . Multiple Vitamin (MULTIVITAMIN WITH MINERALS) TABS tablet Take 1 tablet by mouth daily.    . Probiotic Product (PROBIOTIC PO) Take 1 capsule by mouth every morning.    Marland Kitchen QUEtiapine (SEROQUEL XR) 300 MG 24 hr tablet Take 300 mg by mouth at bedtime.     . SUMAtriptan (IMITREX) 25 MG tablet Take 25 mg by mouth See admin instructions. Take 25 mg at onset of migraine.  May repeat in 2 hours if needed.    . Wheat Dextrin (BENEFIBER) POWD Take 2 Units by mouth See admin instructions. Take 2 tablespoons once daily.        Allergies  Allergen Reactions  . Other     [place].  Stinging mouth sores.      REVIEW OF SYSTEMS: Skin:  No history of rash.  No history  of abnormal moles. Infection:  No history of hepatitis or HIV.  No history of MRSA. Neurologic:  No history of stroke.  No history of seizure.  No history of headaches. Cardiac:  No history of hypertension. No history of heart disease.   Pulmonary:  Does not smoke cigarettes.  No asthma or bronchitis.  No OSA/CPAP.  Endocrine:  No diabetes. No thyroid disease. Gastrointestinal:  See HPI Urologic:  No history of kidney stones.  No history of bladder infections. Musculoskeletal:  No history of joint or back disease. Hematologic:  No bleeding disorder.  No history of anemia.  Not anticoagulated. Psycho-social:  History of bipolar disease - followed by Dr. Toy Cookey  SOCIAL and FAMILY HISTORY: Married. Husband with her. She has no  children. She works from home.  PHYSICAL EXAM: BP 125/86 mmHg  Pulse 88  Temp(Src) 98.1 F (36.7 C) (Oral)  Resp 15  SpO2 100%  LMP 11/25/2015  General: WN WF who is alert and generally healthy appearing.  HEENT: Normal. Pupils equal. Neck: Supple. No mass.  No thyroid mass. Lymph Nodes:  No supraclavicular or cervical nodes. Lungs: Clear to auscultation and symmetric breath sounds. Heart:  RRR. No murmur or rub. Abdomen: Tender RLQ with some rebound.  Rare BS.  No scars, but plenty of tattoos. Rectal: Not done. Extremities:  Good strength and ROM  in upper and lower extremities.  Tattoos. Neurologic:  Grossly intact to motor and sensory function. Psychiatric: Has normal mood and affect. Behavior is normal.   DATA REVIEWED: Epic notes.  Alphonsa Overall, MD,  [facility] Surgery, [place] [address]  [place], [place]    [place] Phone:  [phone] FAX:  [phone]

## 2015-12-23 NOTE — ED Notes (Signed)
Awake. Verbally responsive. A/O x4. Resp even and unlabored. No audible adventitious breath sounds noted. ABC's intact. IV infusing NS at 999ml/hr without difficulty. 

## 2015-12-23 NOTE — Anesthesia Procedure Notes (Signed)
Procedure Name: Intubation Date/Time: 12/23/2015 9:22 PM Performed by: Lajuana Carry E Pre-anesthesia Checklist: Patient identified, Emergency Drugs available, Suction available and Patient being monitored Patient Re-evaluated:Patient Re-evaluated prior to inductionOxygen Delivery Method: Circle System Utilized Preoxygenation: Pre-oxygenation with 100% oxygen Intubation Type: IV induction, Rapid sequence and Cricoid Pressure applied Laryngoscope Size: Miller and 2 Grade View: Grade I Tube type: Oral Tube size: 7.0 mm Number of attempts: 1 Airway Equipment and Method: Stylet Placement Confirmation: ETT inserted through vocal cords under direct vision,  positive ETCO2 and breath sounds checked- equal and bilateral Secured at: 22 cm Tube secured with: Tape Dental Injury: Teeth and Oropharynx as per pre-operative assessment

## 2015-12-23 NOTE — ED Notes (Signed)
Dr. Newman at bedside. 

## 2015-12-23 NOTE — ED Notes (Signed)
Pt reported RLQ pain radiates to rt leg and groin area. Pt reported n/v x4 and diarrhea x2 in past 24hrs. Pt denies fever. Pt reported foul odor urine but denies hematuria, urinary frequency/urgency and pressure/burning with voiding.

## 2015-12-24 ENCOUNTER — Encounter (HOSPITAL_COMMUNITY): Payer: Self-pay | Admitting: Surgery

## 2015-12-24 LAB — CBC
HCT: 38.6 % (ref 36.0–46.0)
HEMOGLOBIN: 12.4 g/dL (ref 12.0–15.0)
MCH: 30.9 pg (ref 26.0–34.0)
MCHC: 32.1 g/dL (ref 30.0–36.0)
MCV: 96.3 fL (ref 78.0–100.0)
PLATELETS: 279 10*3/uL (ref 150–400)
RBC: 4.01 MIL/uL (ref 3.87–5.11)
RDW: 13.8 % (ref 11.5–15.5)
WBC: 14.4 10*3/uL — ABNORMAL HIGH (ref 4.0–10.5)

## 2015-12-24 LAB — BASIC METABOLIC PANEL
Anion gap: 9 (ref 5–15)
BUN: 6 mg/dL (ref 6–20)
CALCIUM: 8.9 mg/dL (ref 8.9–10.3)
CO2: 24 mmol/L (ref 22–32)
CREATININE: 0.91 mg/dL (ref 0.44–1.00)
Chloride: 106 mmol/L (ref 101–111)
Glucose, Bld: 158 mg/dL — ABNORMAL HIGH (ref 65–99)
Potassium: 4.8 mmol/L (ref 3.5–5.1)
SODIUM: 139 mmol/L (ref 135–145)

## 2015-12-24 MED ORDER — HYDROCORTISONE 1 % EX CREA
TOPICAL_CREAM | CUTANEOUS | Status: DC | PRN
  Filled 2015-12-24: qty 28

## 2015-12-24 MED ORDER — PIPERACILLIN-TAZOBACTAM 3.375 G IVPB
3.3750 g | Freq: Three times a day (TID) | INTRAVENOUS | Status: DC
  Administered 2015-12-24 – 2015-12-25 (×4): 3.375 g via INTRAVENOUS
  Filled 2015-12-24 (×4): qty 50

## 2015-12-24 MED ORDER — DIPHENHYDRAMINE HCL 25 MG PO CAPS
25.0000 mg | ORAL_CAPSULE | Freq: Four times a day (QID) | ORAL | Status: DC | PRN
  Administered 2015-12-24 – 2015-12-25 (×2): 25 mg via ORAL
  Filled 2015-12-24 (×2): qty 1

## 2015-12-24 MED ORDER — SODIUM CHLORIDE 0.9 % IJ SOLN
3.0000 mL | Freq: Two times a day (BID) | INTRAMUSCULAR | Status: DC
  Administered 2015-12-24 (×2): 3 mL via INTRAVENOUS

## 2015-12-24 MED ORDER — SODIUM CHLORIDE 0.9 % IJ SOLN: 3.0000 mL | INTRAMUSCULAR | Status: DC | PRN

## 2015-12-24 MED ORDER — ALPRAZOLAM 0.5 MG PO TABS
0.5000 mg | ORAL_TABLET | Freq: Every evening | ORAL | Status: DC | PRN
  Administered 2015-12-24: 0.5 mg via ORAL
  Filled 2015-12-24: qty 1

## 2015-12-24 MED ORDER — MENTHOL 3 MG MT LOZG
1.0000 | LOZENGE | OROMUCOSAL | Status: DC | PRN
  Administered 2015-12-24: 3 mg via ORAL
  Filled 2015-12-24: qty 9

## 2015-12-24 NOTE — Progress Notes (Signed)
Patient ID: Lydia [name], female   DOB: Jul 11, 1976, 39 y.o.   MRN: [id number]     Lydia [name]      [address] [place], [place] [address]    Phone: [phone] FAX: [phone]     Subjective: Ambulating, sore, but better.  No n/v. Afebrile. WBC trending down, 14.4k.   Voiding.  No flatus.  Tolerating clears.   Objective:  Vital signs:  Filed Vitals:   12/23/15 2245 12/23/15 2300 12/23/15 2316 12/24/15 0442  BP: 117/70 125/73 115/66 123/77  Pulse: 92 102 98 78  Temp: 98.4 F (36.9 C) 98.4 F (36.9 C) 98.7 F (37.1 C) 98.2 F (36.8 C)  TempSrc:    Oral  Resp: _0 Height:      Weight:      SpO2: 100% 100% 100% 98%    Last BM Date: 12/23/15  Intake/Output   Yesterday:  01/23 0701 - 01/24 0700 In: 2685.8 [P.O.:240; I.V.:2445.8] Out: 1350 [Urine:1350] This shift:  Total I/O In: 240 [P.O.:240] Out: 900 [Urine:900]   Physical Exam: General: Pt awake/alert/oriented x4 in no acute distress Chest: cta.  No chest wall pain w good excursion CV:  Pulses intact.  Regular rhythm MS: Normal AROM mjr joints.  No obvious deformity Abdomen: Soft.  Nondistended.  +Bs, ttp over incisions and RLQ.  No evidence of peritonitis.  No incarcerated hernias. Ext:  SCDs BLE.  No mjr edema.  No cyanosis Skin: No petechiae / purpura   Problem List:   Active Problems:   Appendicitis    Results:   Labs: Results for orders placed or performed during the hospital encounter of 12/23/15 (from the past 48 hour(s))  Lipase, blood     Status: None   Collection Time: 12/23/15 12:38 PM  Result Value Ref Range   Lipase 23 11 - 51 U/L  Comprehensive metabolic panel     Status: Abnormal   Collection Time: 12/23/15 12:38 PM  Result Value Ref Range   Sodium 139 135 - 145 mmol/L   Potassium 3.9 3.5 - 5.1 mmol/L   Chloride 104 101 - 111 mmol/L   CO2 24 22 - 32 mmol/L   Glucose, Bld 157 (H) 65 - 99 mg/dL   BUN 8 6 - 20 mg/dL   Creatinine, Ser 0.89 0.44 - 1.00 mg/dL   Calcium 9.4 8.9 - 10.3 mg/dL   Total Protein 8.2 (H) 6.5 - 8.1 g/dL   Albumin 4.9 3.5 - 5.0 g/dL   AST 21 15 - 41 U/L   ALT 20 14 - 54 U/L   Alkaline Phosphatase 73 38 - 126 U/L   Total Bilirubin 0.5 0.3 - 1.2 mg/dL   GFR calc non Af Amer >60 >60 mL/min   GFR calc Af Amer >60 >60 mL/min    Comment: (NOTE) The eGFR has been calculated using the CKD EPI equation. This calculation has not been validated in all clinical situations. eGFR's persistently <60 mL/min signify possible Chronic Kidney Disease.    Anion gap 11 5 - 15  CBC     Status: Abnormal   Collection Time: 12/23/15 12:38 PM  Result Value Ref Range   WBC 14.8 (H) 4.0 - 10.5 K/uL   RBC 4.45 3.87 - 5.11 MIL/uL   Hemoglobin 13.8 12.0 - 15.0 g/dL   HCT 42.0 36.0 - 46.0 %   MCV 94.4 78.0 - 100.0 fL   MCH 31.0 26.0 - 34.0 pg   MCHC  32.9 30.0 - 36.0 g/dL   RDW 98.8 71.5 - 50.9 %   Platelets 303 150 - 400 K/uL  I-Stat beta hCG blood, ED ([facility], [facility], [facility] only)     Status: None   Collection Time: 12/23/15  1:16 PM  Result Value Ref Range   I-stat hCG, quantitative <5.0 <5 mIU/mL   Comment 3            Comment:   GEST. AGE      CONC.  (mIU/mL)   <=1 WEEK        5 - 50     2 WEEKS       50 - 500     3 WEEKS       100 - 10,000     4 WEEKS     1,000 - 30,000        FEMALE AND NON-PREGNANT FEMALE:     LESS THAN 5 mIU/mL   Urinalysis, Routine w reflex microscopic (not at [facility])     Status: Abnormal   Collection Time: 12/23/15  3:10 PM  Result Value Ref Range   Color, Urine YELLOW YELLOW   APPearance CLEAR CLEAR   Specific Gravity, Urine 1.029 1.005 - 1.030   pH 6.0 5.0 - 8.0   Glucose, UA NEGATIVE NEGATIVE mg/dL   Hgb urine dipstick SMALL (A) NEGATIVE   Bilirubin Urine NEGATIVE NEGATIVE   Ketones, ur >80 (A) NEGATIVE mg/dL   Protein, ur NEGATIVE NEGATIVE mg/dL   Nitrite NEGATIVE NEGATIVE   Leukocytes, UA NEGATIVE NEGATIVE  Urine microscopic-add on     Status: Abnormal   Collection  Time: 12/23/15  3:10 PM  Result Value Ref Range   Squamous Epithelial / LPF 0-5 (A) NONE SEEN   WBC, UA 0-5 0 - 5 WBC/hpf   RBC / HPF 6-30 0 - 5 RBC/hpf   Bacteria, UA FEW (A) NONE SEEN   Urine-Other MUCOUS PRESENT   CBC     Status: Abnormal   Collection Time: 12/24/15  5:08 AM  Result Value Ref Range   WBC 14.4 (H) 4.0 - 10.5 K/uL   RBC 4.01 3.87 - 5.11 MIL/uL   Hemoglobin 12.4 12.0 - 15.0 g/dL   HCT 57.3 61.1 - 60.8 %   MCV 96.3 78.0 - 100.0 fL   MCH 30.9 26.0 - 34.0 pg   MCHC 32.1 30.0 - 36.0 g/dL   RDW 89.9 13.5 - 16.7 %   Platelets 279 150 - 400 K/uL  Basic metabolic panel     Status: Abnormal   Collection Time: 12/24/15  5:08 AM  Result Value Ref Range   Sodium 139 135 - 145 mmol/L   Potassium 4.8 3.5 - 5.1 mmol/L    Comment: RESULT REPEATED AND VERIFIED DELTA CHECK NOTED NO VISIBLE HEMOLYSIS    Chloride 106 101 - 111 mmol/L   CO2 24 22 - 32 mmol/L   Glucose, Bld 158 (H) 65 - 99 mg/dL   BUN 6 6 - 20 mg/dL   Creatinine, Ser 0.39 0.44 - 1.00 mg/dL   Calcium 8.9 8.9 - 33.8 mg/dL   GFR calc non Af Amer >60 >60 mL/min   GFR calc Af Amer >60 >60 mL/min    Comment: (NOTE) The eGFR has been calculated using the CKD EPI equation. This calculation has not been validated in all clinical situations. eGFR's persistently <60 mL/min signify possible Chronic Kidney Disease.    Anion gap 9 5 - 15    Imaging / Studies: Ct  Abdomen Pelvis W Contrast  12/23/2015  CLINICAL DATA:  Right lower quadrant abdominal pain radiating to the right leg and groin area. Nausea and vomiting and diarrhea over the past 24 hours. EXAM: CT ABDOMEN AND PELVIS WITH CONTRAST TECHNIQUE: Multidetector CT imaging of the abdomen and pelvis was performed using the standard protocol following bolus administration of intravenous contrast. CONTRAST:  150m OMNIPAQUE IOHEXOL 300 MG/ML  SOLN COMPARISON:  03/06/2013 lumbar spine MRI FINDINGS: Lower chest: Linear subsegmental atelectasis in the posterior basal  segments of both lower lobes. Hepatobiliary: Unremarkable Pancreas: Borderline prominent dorsal pancreatic duct, 3 mm diameter in the pancreatic head. Spleen: Unremarkable Adrenals/Urinary Tract: Unremarkable Stomach/Bowel: Acute appendicitis observed, with the appendix measuring 1.6 cm in diameter, with periappendiceal stranding, and with multiple small appendicoliths. No abscess or extraluminal gas in this vicinity. The appendix sits just below the cecum. There is some scattered air-fluid levels in nondilated small bowel likely from ileus. Vascular/Lymphatic: Unremarkable Reproductive: Unremarkable Other: No supplemental non-categorized findings. Musculoskeletal: Unremarkable IMPRESSION: 1. Acute nonruptured appendicitis. Appendiceal diameter 1.6 cm, periappendiceal stranding observed. These results will be called to the ordering clinician or representative by the Radiologist Assistant, and communication documented in the PACS or zVision Dashboard. Electronically Signed   By: WVan ClinesM.D.   On: 12/23/2015 17:03    Medications / Allergies:  Scheduled Meds: . amphetamine-dextroamphetamine  20 mg Oral q morning - 10a  . buPROPion  300 mg Oral q morning - 10a  . cefTRIAXone (ROCEPHIN)  IV  2 g Intravenous Q24H   And  . metronidazole  500 mg Intravenous Q8H  . FLUoxetine  20 mg Oral q morning - 10a  . heparin  5,000 Units Subcutaneous 3 times per day  . Influenza vac split quadrivalent PF  0.5 mL Intramuscular Tomorrow-1000  . piperacillin-tazobactam (ZOSYN)  IV  3.375 g Intravenous 3 times per day  . QUEtiapine  300 mg Oral QHS  . sodium chloride  3 mL Intravenous Q12H   Continuous Infusions:  PRN Meds:.ALPRAZolam, HYDROcodone-acetaminophen, ibuprofen, menthol-cetylpyridinium, morphine injection, ondansetron **OR** ondansetron (ZOFRAN) IV, sodium chloride  Antibiotics: Anti-infectives    Start     Dose/Rate Route Frequency Ordered Stop   12/23/15 2345  piperacillin-tazobactam (ZOSYN)  IVPB 3.375 g     3.375 g 12.5 mL/hr over 240 Minutes Intravenous 3 times per day 12/23/15 2331     12/23/15 1853  cefTRIAXone (ROCEPHIN) 2 g in dextrose 5 % 50 mL IVPB     2 g 100 mL/hr over 30 Minutes Intravenous Every 24 hours 12/23/15 1853     12/23/15 1853  metroNIDAZOLE (FLAGYL) IVPB 500 mg     500 mg 100 mL/hr over 60 Minutes Intravenous Every 8 hours 12/23/15 1853          Assessment/Plan POD#1 laparoscopic appendectomy---Dr. NLucia Gaskins-continue inpatient for IV antibiotics, advance diet, mobilize ID-DC zosyn, c/w rocephin/flagyl D31/7 FEN-fulls, SLIV, encourage PO pain meds Psych-home meds VTE prophylaxis-SCD/heparin   EErby Pian ANP-BC [facility] Pager [phone](7A-4:30P)   12/24/2015 7:56 AM

## 2015-12-25 LAB — BASIC METABOLIC PANEL
ANION GAP: 9 (ref 5–15)
BUN: 8 mg/dL (ref 6–20)
CHLORIDE: 106 mmol/L (ref 101–111)
CO2: 26 mmol/L (ref 22–32)
CREATININE: 0.9 mg/dL (ref 0.44–1.00)
Calcium: 8.6 mg/dL — ABNORMAL LOW (ref 8.9–10.3)
GFR calc non Af Amer: >60 mL/min (ref >60)
Glucose, Bld: 108 mg/dL — ABNORMAL HIGH (ref 65–99)
POTASSIUM: 3.4 mmol/L — AB (ref 3.5–5.1)
SODIUM: 141 mmol/L (ref 135–145)

## 2015-12-25 LAB — CBC
HCT: 33.9 % — ABNORMAL LOW (ref 36.0–46.0)
HEMOGLOBIN: 11 g/dL — AB (ref 12.0–15.0)
MCH: 31.4 pg (ref 26.0–34.0)
MCHC: 32.4 g/dL (ref 30.0–36.0)
MCV: 96.9 fL (ref 78.0–100.0)
Platelets: 256 10*3/uL (ref 150–400)
RBC: 3.5 MIL/uL — AB (ref 3.87–5.11)
RDW: 14.1 % (ref 11.5–15.5)
WBC: 10.1 10*3/uL (ref 4.0–10.5)

## 2015-12-25 MED ORDER — OXYCODONE-ACETAMINOPHEN 5-325 MG PO TABS: 1.0000 | ORAL_TABLET | Freq: Four times a day (QID) | ORAL | Status: DC | PRN

## 2015-12-25 MED ORDER — IBUPROFEN 600 MG PO TABS: 600.0000 mg | ORAL_TABLET | Freq: Four times a day (QID) | ORAL | Status: DC | PRN

## 2015-12-25 MED ORDER — HYDROCODONE-ACETAMINOPHEN 5-325 MG PO TABS: 1.0000 | ORAL_TABLET | ORAL | Status: DC | PRN

## 2015-12-25 MED ORDER — AMOXICILLIN-POT CLAVULANATE 875-125 MG PO TABS: 1.0000 | ORAL_TABLET | Freq: Two times a day (BID) | ORAL | Status: DC

## 2015-12-25 NOTE — Discharge Instructions (Signed)

## 2015-12-25 NOTE — Discharge Summary (Signed)
Physician Discharge Summary  Patient ID: Lydia [name] MRN: [id number] DOB/AGE: 41/05/1976 41 y.o.  Admit date: 12/23/2015 Discharge date: 12/25/2015  Admitting Diagnosis: Acute appendicitis   Discharge Diagnosis Patient Active Problem List   Diagnosis Date Noted  . Appendicitis 12/23/2015    Consultants none  Imaging: Ct Abdomen Pelvis W Contrast  12/23/2015  CLINICAL DATA:  Right lower quadrant abdominal pain radiating to the right leg and groin area. Nausea and vomiting and diarrhea over the past 24 hours. EXAM: CT ABDOMEN AND PELVIS WITH CONTRAST TECHNIQUE: Multidetector CT imaging of the abdomen and pelvis was performed using the standard protocol following bolus administration of intravenous contrast. CONTRAST:  153mL OMNIPAQUE IOHEXOL 300 MG/ML  SOLN COMPARISON:  03/06/2013 lumbar spine MRI FINDINGS: Lower chest: Linear subsegmental atelectasis in the posterior basal segments of both lower lobes. Hepatobiliary: Unremarkable Pancreas: Borderline prominent dorsal pancreatic duct, 3 mm diameter in the pancreatic head. Spleen: Unremarkable Adrenals/Urinary Tract: Unremarkable Stomach/Bowel: Acute appendicitis observed, with the appendix measuring 1.6 cm in diameter, with periappendiceal stranding, and with multiple small appendicoliths. No abscess or extraluminal gas in this vicinity. The appendix sits just below the cecum. There is some scattered air-fluid levels in nondilated small bowel likely from ileus. Vascular/Lymphatic: Unremarkable Reproductive: Unremarkable Other: No supplemental non-categorized findings. Musculoskeletal: Unremarkable IMPRESSION: 1. Acute nonruptured appendicitis. Appendiceal diameter 1.6 cm, periappendiceal stranding observed. These results will be called to the ordering clinician or representative by the Radiologist Assistant, and communication documented in the PACS or zVision Dashboard. Electronically Signed   By: Van Clines M.D.   On: 12/23/2015 17:03     Procedures Laparoscopic appendectomy---Dr. Darci Current Course:  Lydia [name] [facility] with abdominal pain.  CTshowed acute appendicits.  Patient was admitted and underwent procedure listed above.  She was found to have infarcted purulent appendicitis. Tolerated procedure well and was transferred to the floor.  Diet was advanced as tolerated and kept on IV antibiotics on POD#1.  On POD#2, the patient was voiding well, tolerating diet, ambulating well, pain well controlled, vital signs stable, incisions c/d/i and felt stable for discharge home.  Medication risks, benefits and therapeutic alternatives were reviewed with the patient.  She verbalizes understanding.  Patient will follow up in our office in 3 weeks and knows to call with questions or concerns.  Physical Exam: General:  Alert, NAD, pleasant, comfortable Abd:  Soft, ND, mild tenderness, incisions C/D/I    Medication List    TAKE these medications        ALPRAZolam 0.5 MG tablet  Commonly known as:  XANAX  Take 0.5 mg by mouth at bedtime as needed for anxiety or sleep.     amoxicillin-clavulanate 875-125 MG tablet  Commonly known as:  AUGMENTIN  Take 1 tablet by mouth 2 (two) times daily.     amphetamine-dextroamphetamine 20 MG tablet  Commonly known as:  ADDERALL  Take 20 mg by mouth every morning.     BENEFIBER Powd  Take 2 Units by mouth See admin instructions. Take 2 tablespoons once daily.     buPROPion 300 MG 24 hr tablet  Commonly known as:  WELLBUTRIN XL  Take 300 mg by mouth every morning.     cholecalciferol 1000 units tablet  Commonly known as:  VITAMIN D  Take 2,000-4,000 Units by mouth daily. Take 2000 units, alternating with 4000 units once daily.     FLUoxetine 20 MG capsule  Commonly known as:  PROZAC  Take 20 mg by mouth every morning.  ibuprofen 600 MG tablet  Commonly known as:  ADVIL,MOTRIN  Take 1 tablet (600 mg total) by mouth every 6 (six) hours as needed for mild pain.      IRON PO  Take 1-2 tablets by mouth every morning. Take 1 tablet, alternating with 2 tablets once daily.     multivitamin with minerals Tabs tablet  Take 1 tablet by mouth daily.     oxyCODONE-acetaminophen 5-325 MG tablet  Commonly known as:  ROXICET  Take 1-2 tablets by mouth every 6 (six) hours as needed for severe pain.     PROBIOTIC PO  Take 1 capsule by mouth every morning.     QUEtiapine 300 MG 24 hr tablet  Commonly known as:  SEROQUEL XR  Take 300 mg by mouth at bedtime.     SUMAtriptan 25 MG tablet  Commonly known as:  IMITREX  Take 25 mg by mouth See admin instructions. Take 25 mg at onset of migraine.  May repeat in 2 hours if needed.             Follow-up Information    Follow up with [facility] On 01/15/2016.   Specialty:  General Surgery   Why:  appt time 12PM, arrive 30 mins prior to appt time to complete paperwork   Contact information:   [address] [place] [address] [phone]       Signed: Erby Pian, [facility] [phone]  12/25/2015, 7:39 AM

## 2017-01-22 ENCOUNTER — Other Ambulatory Visit: Payer: Self-pay | Admitting: Otolaryngology

## 2017-01-22 DIAGNOSIS — H9122 Sudden idiopathic hearing loss, left ear: Secondary | ICD-10-CM

## 2018-12-02 ENCOUNTER — Other Ambulatory Visit (HOSPITAL_COMMUNITY): Payer: Self-pay | Admitting: Respiratory Therapy

## 2018-12-02 DIAGNOSIS — R062 Wheezing: Secondary | ICD-10-CM

## 2018-12-02 DIAGNOSIS — R064 Hyperventilation: Secondary | ICD-10-CM

## 2018-12-02 DIAGNOSIS — R05 Cough: Secondary | ICD-10-CM

## 2018-12-02 DIAGNOSIS — R059 Cough, unspecified: Secondary | ICD-10-CM

## 2018-12-26 ENCOUNTER — Ambulatory Visit (HOSPITAL_COMMUNITY)
Admission: RE | Admit: 2018-12-26 | Discharge: 2018-12-26 | Disposition: A | Discharge status: Home / Self Care | Payer: Managed Care, Other (non HMO) | Source: Ambulatory Visit | Attending: Internal Medicine | Admitting: Internal Medicine

## 2018-12-26 DIAGNOSIS — R064 Hyperventilation: Secondary | ICD-10-CM | POA: Diagnosis present

## 2018-12-26 DIAGNOSIS — R062 Wheezing: Secondary | ICD-10-CM | POA: Diagnosis not present

## 2018-12-26 DIAGNOSIS — R059 Cough, unspecified: Secondary | ICD-10-CM

## 2018-12-26 DIAGNOSIS — R05 Cough: Secondary | ICD-10-CM | POA: Insufficient documentation

## 2018-12-26 LAB — PULMONARY FUNCTION TEST
DL/VA % PRED: 60 %
DL/VA: 2.99 ml/min/mmHg/L
DLCO unc % pred: 65 %
DLCO unc: 16.66 ml/min/mmHg
FEF 25-75 POST: 4.22 L/s
FEF 25-75 PRE: 3.37 L/s
FEF2575-%CHANGE-POST: 25 %
FEF2575-%PRED-PRE: 109 %
FEF2575-%Pred-Post: 136 %
FEV1-%Change-Post: 3 %
FEV1-%PRED-POST: 122 %
FEV1-%Pred-Pre: 118 %
FEV1-PRE: 3.63 L
FEV1-Post: 3.74 L
FEV1FVC-%CHANGE-POST: 2 %
FEV1FVC-%PRED-PRE: 97 %
FEV6-%CHANGE-POST: 0 %
FEV6-%PRED-PRE: 120 %
FEV6-%Pred-Post: 120 %
FEV6-Post: 4.46 L
FEV6-Pre: 4.47 L
FEV6FVC-%Change-Post: 0 %
FEV6FVC-%Pred-Post: 99 %
FEV6FVC-%Pred-Pre: 99 %
FVC-%CHANGE-POST: 0 %
FVC-%Pred-Post: 120 %
FVC-%Pred-Pre: 120 %
FVC-Post: 4.57 L
FVC-Pre: 4.57 L
POST FEV6/FVC RATIO: 98 %
PRE FEV1/FVC RATIO: 79 %
Post FEV1/FVC ratio: 82 %
Pre FEV6/FVC Ratio: 98 %

## 2018-12-26 MED ORDER — ALBUTEROL SULFATE (2.5 MG/3ML) 0.083% IN NEBU
2.5000 mg | INHALATION_SOLUTION | Freq: Once | RESPIRATORY_TRACT | Status: AC
  Administered 2018-12-26: 2.5 mg via RESPIRATORY_TRACT

## 2019-11-21 ENCOUNTER — Other Ambulatory Visit: Payer: Self-pay | Admitting: Internal Medicine

## 2019-11-21 DIAGNOSIS — N6320 Unspecified lump in the left breast, unspecified quadrant: Secondary | ICD-10-CM

## 2019-11-22 ENCOUNTER — Other Ambulatory Visit: Payer: Self-pay | Admitting: Internal Medicine

## 2019-11-22 DIAGNOSIS — N6310 Unspecified lump in the right breast, unspecified quadrant: Secondary | ICD-10-CM

## 2019-12-08 ENCOUNTER — Ambulatory Visit
Admission: RE | Admit: 2019-12-08 | Discharge: 2019-12-08 | Disposition: A | Discharge status: Home / Self Care | Payer: Managed Care, Other (non HMO) | Source: Ambulatory Visit | Attending: Internal Medicine | Admitting: Internal Medicine

## 2019-12-08 ENCOUNTER — Other Ambulatory Visit: Payer: Self-pay

## 2019-12-08 ENCOUNTER — Other Ambulatory Visit: Payer: Self-pay | Admitting: Internal Medicine

## 2019-12-08 ENCOUNTER — Other Ambulatory Visit: Payer: Managed Care, Other (non HMO)

## 2019-12-08 DIAGNOSIS — N6320 Unspecified lump in the left breast, unspecified quadrant: Secondary | ICD-10-CM

## 2019-12-08 DIAGNOSIS — N6310 Unspecified lump in the right breast, unspecified quadrant: Secondary | ICD-10-CM

## 2020-01-27 ENCOUNTER — Emergency Department (HOSPITAL_COMMUNITY)
Admission: EM | Admit: 2020-01-27 | Discharge: 2020-01-27 | Disposition: A | Discharge status: Home / Self Care | Payer: Managed Care, Other (non HMO) | Attending: Emergency Medicine | Admitting: Emergency Medicine

## 2020-01-27 ENCOUNTER — Other Ambulatory Visit: Payer: Self-pay

## 2020-01-27 ENCOUNTER — Encounter (HOSPITAL_COMMUNITY): Payer: Self-pay | Admitting: *Deleted

## 2020-01-27 DIAGNOSIS — R42 Dizziness and giddiness: Secondary | ICD-10-CM | POA: Insufficient documentation

## 2020-01-27 DIAGNOSIS — N939 Abnormal uterine and vaginal bleeding, unspecified: Secondary | ICD-10-CM | POA: Diagnosis present

## 2020-01-27 DIAGNOSIS — R531 Weakness: Secondary | ICD-10-CM | POA: Diagnosis not present

## 2020-01-27 DIAGNOSIS — Z87891 Personal history of nicotine dependence: Secondary | ICD-10-CM | POA: Insufficient documentation

## 2020-01-27 DIAGNOSIS — Z79899 Other long term (current) drug therapy: Secondary | ICD-10-CM | POA: Diagnosis not present

## 2020-01-27 HISTORY — DX: Obsessive-compulsive disorder, unspecified: F42.9

## 2020-01-27 HISTORY — DX: Attention-deficit hyperactivity disorder, unspecified type: F90.9

## 2020-01-27 LAB — POC URINE PREG, ED: Preg Test, Ur: NEGATIVE

## 2020-01-27 NOTE — ED Triage Notes (Addendum)
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 States she fell this morning about 0530 going to [employer]

## 2020-01-27 NOTE — ED Notes (Signed)
Patient refuse vital sign before discharged.

## 2020-01-27 NOTE — ED Provider Notes (Signed)
[facility] DEPT Provider Note   CSN: [id number] Arrival date & time: 01/27/20  [id number]     History Chief Complaint  Patient presents with  . Vaginal Bleeding    Lydia [name] is a 44 y.o. female who presents for vaginal bleeding. Pt states she developed sudden onset vaginal bleeding yesterday around 11pm. Finished her LMP on 2/25 and her cycle lasted 6 days before that. Has been using a menstrual cup and changing it every 1-2 hours. Seeing bright red and clots in the bleeding, which pt says is new for her. Pt endorses regular periods every 26 days. Denies history of heavy periods/bleeding, fibroids, or irregular periods. Only sexually active with her husband. Pt is not on any blood thinners or hormone therapies (no OCPs or IUD).  This morning, pt felt weak and dizzy upon standing. She did not lose consciousness and was able to brace herself on the wall/door near her. No head trauma or other injuries. Pt says her husband was with her and able to help her up and then to the car to the emergency room.    Vaginal Bleeding Quality:  Bright red and clots Severity:  Moderate Onset quality:  Sudden Duration:  10 hours Timing:  Constant Progression:  Unchanged Chronicity:  New Menstrual history:  Regular Number of pads used:  Pt using menstrual cup, changing it every 1-2hrs overnight Possible pregnancy: no   Context: spontaneously   Relieved by:  None tried Worsened by:  Position Ineffective treatments:  None tried Associated symptoms: abdominal pain, dizziness and fatigue   Associated symptoms: no back pain, no dysuria, no fever, no nausea and no vaginal discharge   Risk factors: no bleeding disorder, no new sexual partner and no STD exposure       Past Medical History:  Diagnosis Date  . ADHD   . Bipolar disorder ([place])   . OCD (obsessive compulsive disorder)     Patient Active Problem List   Diagnosis Date Noted  . Appendicitis 12/23/2015    Past  Surgical History:  Procedure Laterality Date  . HEMORRHOID SURGERY    . LAPAROSCOPIC APPENDECTOMY N/A 12/23/2015   Procedure: APPENDECTOMY LAPAROSCOPIC;  Surgeon: Alphonsa Overall, MD;  Location: [facility] ORS;  Service: General;  Laterality: N/A;     OB History   No obstetric history on file.     No family history on file.  Social History   Tobacco Use  . Smoking status: Former Smoker    Years: 10.00    Types: Cigarettes    Quit date: 12/22/1998    Years since quitting: 21.1  . Smokeless tobacco: Former Systems developer    Types: Snuff    Quit date: 12/23/1995  Substance Use Topics  . Alcohol use: No    Comment: none in the last month - 12/23/15  . Drug use: No    Home Medications Prior to Admission medications   Medication Sig Start Date End Date Taking? Authorizing Provider  ALPRAZolam Duanne Moron) 1 MG tablet Take 0.5 mg by mouth at bedtime as needed for sleep.  12/29/19  Yes [provider]  amphetamine-dextroamphetamine (ADDERALL) 20 MG tablet Take 20 mg by mouth every morning.  12/11/15  Yes [provider]  buPROPion (WELLBUTRIN XL) 300 MG 24 hr tablet Take 300 mg by mouth every morning.  09/17/15  Yes [provider]  FLUoxetine (PROZAC) 20 MG capsule Take 20 mg by mouth every morning.  09/17/15  Yes [provider]  lamoTRIgine (LAMICTAL) 150 MG  tablet Take 150 mg by mouth at bedtime. 11/17/19  Yes [provider]  Multiple Vitamin (MULTIVITAMIN WITH MINERALS) TABS tablet Take 1 tablet by mouth daily.   Yes [provider]  QUEtiapine (SEROQUEL XR) 300 MG 24 hr tablet Take 300 mg by mouth at bedtime.  11/13/15  Yes [provider]  SUMAtriptan (IMITREX) 25 MG tablet Take 25 mg by mouth See admin instructions. Take 25 mg at onset of migraine.  May repeat in 2 hours if needed. 12/19/15  Yes [provider]  Wheat Dextrin (BENEFIBER) POWD Take 2 Scoops by mouth daily. Mixed in with coffee   Yes [provider]    amoxicillin-clavulanate (AUGMENTIN) 875-125 MG tablet Take 1 tablet by mouth 2 (two) times daily. Patient not taking: Reported on 01/27/2020 12/25/15   Erby Pian, NP  ibuprofen (ADVIL,MOTRIN) 600 MG tablet Take 1 tablet (600 mg total) by mouth every 6 (six) hours as needed for mild pain. Patient not taking: Reported on 01/27/2020 12/25/15   Erby Pian, NP  oxyCODONE-acetaminophen (ROXICET) 5-325 MG tablet Take 1-2 tablets by mouth every 6 (six) hours as needed for severe pain. Patient not taking: Reported on 01/27/2020 12/25/15   Erby Pian, NP    Allergies    Other  Review of Systems   Review of Systems  Constitutional: Positive for fatigue. Negative for chills, diaphoresis and fever.  HENT: Negative for congestion and sore throat.   Eyes: Negative for visual disturbance.  Respiratory: Negative for chest tightness and shortness of breath.   Cardiovascular: Negative for chest pain and palpitations.  Gastrointestinal: Positive for abdominal pain. Negative for nausea.  Genitourinary: Positive for pelvic pain (cramping) and vaginal bleeding. Negative for dysuria, frequency and vaginal discharge.  Musculoskeletal: Negative for back pain.  Skin: Negative.   Neurological: Positive for dizziness and light-headedness. Negative for syncope and weakness.  Psychiatric/Behavioral: Negative for confusion.    Physical Exam Updated Vital Signs BP 107/65 (BP Location: Left Arm)   Pulse 83   Temp 98.3 F (36.8 C) (Oral)   Resp 17   Ht 5\' 5"  (1.651 m)   Wt 67.1 kg   SpO2 96%   BMI 24.63 kg/m   Physical Exam Vitals and nursing note reviewed. Exam conducted with a chaperone present.  Constitutional:      General: She is not in acute distress.    Appearance: She is not ill-appearing.     Comments: Pt difficult to understand and mumbling. Awake and alert, speaking with eyes closed.  HENT:     Head: Normocephalic and atraumatic.     Mouth/Throat:     Mouth: Mucous membranes are  moist.  Cardiovascular:     Rate and Rhythm: Normal rate and regular rhythm.     Heart sounds: Normal heart sounds.  Pulmonary:     Effort: Pulmonary effort is normal. No respiratory distress.     Breath sounds: Normal breath sounds.  Abdominal:     General: Abdomen is flat. Bowel sounds are normal. There is no distension.     Palpations: Abdomen is soft.     Tenderness: There is no abdominal tenderness. There is no guarding or rebound.     Comments: On external abdominal exam, pt winces before being touched though abdomin is soft  Genitourinary:    General: Normal vulva.     Pubic Area: No rash.      Vagina: Bleeding present.     Cervix: No discharge, friability or erythema.     Uterus:  Normal.      Adnexa: Right adnexa normal and left adnexa normal.     Comments: Pelvic exam with blood in the vagina. Normal cervix without lesion or discharge. No active hemorrhage or lacerations noted. Musculoskeletal:        General: Normal range of motion.     Right lower leg: No edema.     Left lower leg: No edema.  Skin:    General: Skin is warm and dry.  Neurological:     General: No focal deficit present.     Mental Status: She is alert and oriented to person, place, and time.  Psychiatric:        Mood and Affect: Affect is flat.    ED Results / Procedures / Treatments   Labs (all labs ordered are listed, but only abnormal results are displayed) Labs Reviewed  POC URINE PREG, ED  GC/CHLAMYDIA PROBE AMP ([place]) NOT AT [facility]    EKG None  Radiology No results found.  Procedures Procedures (including critical care time)  Medications Ordered in ED Medications - No data to display  ED Course  I have reviewed the triage vital signs and the nursing notes.  Pertinent labs & imaging results that were available during my care of the patient were reviewed by me and considered in my medical decision making (see chart for details).    MDM Rules/Calculators/A&P                       Ms. Mutchler is a 44 y.o. female who presents for vaginal bleeding. Pt's LMP was 2 days ago. She is hemodynamically stable without evidence of a life-threatening bleed. Not hypotensive or tachycardic. Well appearing on exam. Pelvic exam with blood in the vagina and normal cervix. No history of irregular periods, fibroids, or endometriosis. Urine pregnancy test was negative. Suspect pt having residual menstrual bleeding. Discussed with pt the possibility for age related bleeding irregularities, fibroids, or endometrial hyperplasia. Gave OB clinic number for pt to schedule outpatient follow-up. Will discharge pt home from the ED.   Final Clinical Impression(s) / ED Diagnoses Final diagnoses:  Abnormal vaginal bleeding    Rx / DC Orders ED Discharge Orders    None       Ladona Horns, MD 01/27/20 [id number]    Sharlett Iles, MD 01/27/20 [phone]

## 2020-01-27 NOTE — Discharge Instructions (Addendum)
Ms. Lydia, [name] were seen in the emergency room for vaginal bleeding. Pelvic exam did not show any gross evidence of heavy bleeding, infection, or other abnormalities. Please follow-up with the OB/Gyn outpatient clinic for continued monitoring and work-up.

## 2020-01-27 NOTE — ED Notes (Signed)
Pt difficult to understand as she speaks in a whiney voice and constantly tells you she is going to pass out. Removed panty liner in room with scant amt of bright red blood, no clots. After helping pt to be, she requested something to eat and drink.

## 2020-01-30 LAB — GC/CHLAMYDIA PROBE AMP (~~LOC~~) NOT AT ARMC
Chlamydia: NEGATIVE
Neisseria Gonorrhea: NEGATIVE

## 2020-03-08 ENCOUNTER — Other Ambulatory Visit: Payer: Self-pay | Admitting: Obstetrics and Gynecology

## 2020-03-08 ENCOUNTER — Ambulatory Visit: Payer: Managed Care, Other (non HMO) | Attending: Internal Medicine

## 2020-03-08 DIAGNOSIS — Z23 Encounter for immunization: Secondary | ICD-10-CM

## 2020-03-08 NOTE — Progress Notes (Signed)
   Covid-19 Vaccination Clinic  Name:  Lydia [name]    MRN: [id number] DOB: 07/22/1976  03/08/2020  [name] was observed post Covid-19 immunization for 15 minutes without incident. She was provided with Vaccine Information Sheet and instruction to access the V-Safe system.   [name] was instructed to call 911 with any severe reactions post vaccine: Marland Kitchen Difficulty breathing  . Swelling of face and throat  . A fast heartbeat  . A bad rash all over body  . Dizziness and weakness   Immunizations Administered    Name Date Dose VIS Date Route   Pfizer COVID-19 Vaccine 03/08/2020  4:00 PM 0.3 mL 11/10/2019 Intramuscular   Manufacturer: [place]   Lot: [id number]   [place]: [id number]

## 2020-03-21 ENCOUNTER — Other Ambulatory Visit: Payer: Self-pay

## 2020-03-21 ENCOUNTER — Encounter (HOSPITAL_BASED_OUTPATIENT_CLINIC_OR_DEPARTMENT_OTHER): Payer: Self-pay | Admitting: Obstetrics and Gynecology

## 2020-03-21 NOTE — Progress Notes (Signed)
Spoke w/ via phone for pre-op interview---patient Lab needs dos----     Cbc, urine preg          COVID test ------03-25-2020 @240  pm Arrive at -------1130 am 03-28-2020 No food after midnight, clear liquids until 730 am then npo Medications to take morning of surgery -----bupropion, fluoxetine Diabetic medication -----n/a Patient Special Instructions -----none Pre-Op special Istructions -----none Patient verbalized understanding of instructions that were given at this phone interview. Patient denies shortness of breath, chest pain, fever, cough a this phone interview.

## 2020-03-25 ENCOUNTER — Other Ambulatory Visit (HOSPITAL_COMMUNITY)
Admission: RE | Admit: 2020-03-25 | Discharge: 2020-03-25 | Disposition: A | Discharge status: Home / Self Care | Payer: Managed Care, Other (non HMO) | Source: Ambulatory Visit | Attending: Obstetrics and Gynecology | Admitting: Obstetrics and Gynecology

## 2020-03-25 DIAGNOSIS — Z01812 Encounter for preprocedural laboratory examination: Secondary | ICD-10-CM | POA: Diagnosis not present

## 2020-03-25 DIAGNOSIS — Z20822 Contact with and (suspected) exposure to covid-19: Secondary | ICD-10-CM | POA: Insufficient documentation

## 2020-03-25 LAB — SARS CORONAVIRUS 2 (TAT 6-24 HRS): SARS Coronavirus 2: NEGATIVE

## 2020-03-28 ENCOUNTER — Encounter (HOSPITAL_BASED_OUTPATIENT_CLINIC_OR_DEPARTMENT_OTHER)
Admission: RE | Disposition: A | Discharge status: Home / Self Care | Payer: Self-pay | Source: Home / Self Care | Attending: Obstetrics and Gynecology

## 2020-03-28 ENCOUNTER — Ambulatory Visit (HOSPITAL_BASED_OUTPATIENT_CLINIC_OR_DEPARTMENT_OTHER): Payer: Managed Care, Other (non HMO) | Admitting: Certified Registered"

## 2020-03-28 ENCOUNTER — Encounter (HOSPITAL_BASED_OUTPATIENT_CLINIC_OR_DEPARTMENT_OTHER): Payer: Self-pay | Admitting: Obstetrics and Gynecology

## 2020-03-28 ENCOUNTER — Ambulatory Visit (HOSPITAL_BASED_OUTPATIENT_CLINIC_OR_DEPARTMENT_OTHER)
Admission: RE | Admit: 2020-03-28 | Discharge: 2020-03-28 | Disposition: A | Discharge status: Home / Self Care | Payer: Managed Care, Other (non HMO) | Attending: Obstetrics and Gynecology | Admitting: Obstetrics and Gynecology

## 2020-03-28 DIAGNOSIS — D250 Submucous leiomyoma of uterus: Secondary | ICD-10-CM | POA: Insufficient documentation

## 2020-03-28 DIAGNOSIS — Z79899 Other long term (current) drug therapy: Secondary | ICD-10-CM | POA: Diagnosis not present

## 2020-03-28 DIAGNOSIS — F319 Bipolar disorder, unspecified: Secondary | ICD-10-CM | POA: Insufficient documentation

## 2020-03-28 DIAGNOSIS — N938 Other specified abnormal uterine and vaginal bleeding: Secondary | ICD-10-CM | POA: Insufficient documentation

## 2020-03-28 DIAGNOSIS — Z793 Long term (current) use of hormonal contraceptives: Secondary | ICD-10-CM | POA: Diagnosis not present

## 2020-03-28 DIAGNOSIS — F909 Attention-deficit hyperactivity disorder, unspecified type: Secondary | ICD-10-CM | POA: Diagnosis not present

## 2020-03-28 DIAGNOSIS — Z87891 Personal history of nicotine dependence: Secondary | ICD-10-CM | POA: Diagnosis not present

## 2020-03-28 DIAGNOSIS — N840 Polyp of corpus uteri: Secondary | ICD-10-CM | POA: Diagnosis not present

## 2020-03-28 DIAGNOSIS — F429 Obsessive-compulsive disorder, unspecified: Secondary | ICD-10-CM | POA: Diagnosis not present

## 2020-03-28 HISTORY — DX: Abnormal uterine and vaginal bleeding, unspecified: N93.9

## 2020-03-28 HISTORY — PX: DILATATION & CURETTAGE/HYSTEROSCOPY WITH MYOSURE: SHX6511

## 2020-03-28 HISTORY — DX: Headache, unspecified: R51.9

## 2020-03-28 HISTORY — DX: Miliaria rubra: L74.0

## 2020-03-28 HISTORY — DX: Gastro-esophageal reflux disease without esophagitis: K21.9

## 2020-03-28 LAB — CBC
HCT: 37.1 % (ref 36.0–46.0)
Hemoglobin: 11.9 g/dL — ABNORMAL LOW (ref 12.0–15.0)
MCH: 28.7 pg (ref 26.0–34.0)
MCHC: 32.1 g/dL (ref 30.0–36.0)
MCV: 89.6 fL (ref 80.0–100.0)
Platelets: 365 10*3/uL (ref 150–400)
RBC: 4.14 MIL/uL (ref 3.87–5.11)
RDW: 15.6 % — ABNORMAL HIGH (ref 11.5–15.5)
WBC: 8.4 10*3/uL (ref 4.0–10.5)
nRBC: 0 % (ref 0.0–0.2)

## 2020-03-28 LAB — POCT PREGNANCY, URINE: Preg Test, Ur: NEGATIVE

## 2020-03-28 SURGERY — DILATATION & CURETTAGE/HYSTEROSCOPY WITH MYOSURE
Anesthesia: General | Site: Vagina

## 2020-03-28 MED ORDER — ONDANSETRON HCL 4 MG/2ML IJ SOLN
INTRAMUSCULAR | Status: DC | PRN
  Administered 2020-03-28: 4 mg via INTRAVENOUS

## 2020-03-28 MED ORDER — LACTATED RINGERS IV SOLN
INTRAVENOUS | Status: DC
  Administered 2020-03-28: 1000 mL via INTRAVENOUS

## 2020-03-28 MED ORDER — MIDAZOLAM HCL 5 MG/5ML IJ SOLN
INTRAMUSCULAR | Status: DC | PRN
  Administered 2020-03-28: 2 mg via INTRAVENOUS

## 2020-03-28 MED ORDER — HYDROCODONE-ACETAMINOPHEN 5-325 MG PO TABS: 1.0000 | ORAL_TABLET | Freq: Four times a day (QID) | ORAL | 0 refills | Status: AC | PRN

## 2020-03-28 MED ORDER — DEXAMETHASONE SODIUM PHOSPHATE 10 MG/ML IJ SOLN
INTRAMUSCULAR | Status: DC | PRN
  Administered 2020-03-28: 10 mg via INTRAVENOUS

## 2020-03-28 MED ORDER — FENTANYL CITRATE (PF) 100 MCG/2ML IJ SOLN
INTRAMUSCULAR | Status: DC | PRN
  Administered 2020-03-28 (×2): 50 ug via INTRAVENOUS

## 2020-03-28 MED ORDER — FENTANYL CITRATE (PF) 100 MCG/2ML IJ SOLN: 25.0000 ug | INTRAMUSCULAR | Status: DC | PRN

## 2020-03-28 MED ORDER — LIDOCAINE 2% (20 MG/ML) 5 ML SYRINGE
INTRAMUSCULAR | Status: DC | PRN
  Administered 2020-03-28: 80 mg via INTRAVENOUS

## 2020-03-28 MED ORDER — PROPOFOL 10 MG/ML IV BOLUS
INTRAVENOUS | Status: DC | PRN
  Administered 2020-03-28: 200 mg via INTRAVENOUS

## 2020-03-28 MED ORDER — FENTANYL CITRATE (PF) 100 MCG/2ML IJ SOLN
INTRAMUSCULAR | Status: AC
  Filled 2020-03-28: qty 2

## 2020-03-28 MED ORDER — MIDAZOLAM HCL 2 MG/2ML IJ SOLN
INTRAMUSCULAR | Status: AC
  Filled 2020-03-28: qty 2

## 2020-03-28 MED ORDER — PROPOFOL 10 MG/ML IV BOLUS
INTRAVENOUS | Status: AC
  Filled 2020-03-28: qty 20

## 2020-03-28 SURGICAL SUPPLY — 24 items
BIPOLAR CUTTING LOOP 21FR (ELECTRODE)
CANISTER SUCT 3000ML PPV (MISCELLANEOUS) ×3 IMPLANT
CATH ROBINSON RED A/P 16FR (CATHETERS) IMPLANT
COVER WAND RF STERILE (DRAPES) ×3 IMPLANT
DEVICE MYOSURE LITE (MISCELLANEOUS) IMPLANT
DEVICE MYOSURE REACH (MISCELLANEOUS) IMPLANT
DILATOR CANAL MILEX (MISCELLANEOUS) IMPLANT
GAUZE 4X4 16PLY RFD (DISPOSABLE) ×3 IMPLANT
GLOVE BIOGEL PI IND STRL 7.0 (GLOVE) ×1 IMPLANT
GLOVE BIOGEL PI INDICATOR 7.0 (GLOVE) ×2
GLOVE ECLIPSE 6.5 STRL STRAW (GLOVE) ×3 IMPLANT
GOWN STRL REUS W/TWL LRG LVL3 (GOWN DISPOSABLE) ×3 IMPLANT
IV NS IRRIG 3000ML ARTHROMATIC (IV SOLUTION) ×3 IMPLANT
KIT PROCEDURE FLUENT (KITS) ×3 IMPLANT
KIT TURNOVER CYSTO (KITS) ×3 IMPLANT
LOOP CUTTING BIPOLAR 21FR (ELECTRODE) IMPLANT
MYOSURE XL FIBROID (MISCELLANEOUS)
PACK VAGINAL MINOR WOMEN LF (CUSTOM PROCEDURE TRAY) ×3 IMPLANT
PAD OB MATERNITY 4.3X12.25 (PERSONAL CARE ITEMS) ×3 IMPLANT
PAD PREP 24X48 CUFFED NSTRL (MISCELLANEOUS) ×3 IMPLANT
SEAL CERVICAL OMNI LOK (ABLATOR) ×3 IMPLANT
SEAL ROD LENS SCOPE MYOSURE (ABLATOR) ×3 IMPLANT
SYSTEM TISS REMOVAL MYOSURE XL (MISCELLANEOUS) IMPLANT
WATER STERILE IRR 500ML POUR (IV SOLUTION) ×3 IMPLANT

## 2020-03-28 NOTE — Brief Op Note (Signed)
03/28/2020  [id number] PM  PATIENT:  Lydia [name]  44 y.o. female  PRE-OPERATIVE DIAGNOSIS:  Abnormal Uterine Bleeding, Endometrial Mass, Possible Submucosal Fibroid/Polyp  POST-OPERATIVE DIAGNOSIS:  abnormal uterine bleeding, endocervical polyps, submucosal fibroid  PROCEDURE:  diagnostic hysteroscopy, hysteroscopic resection of submucosal fibroid and endocervical polyp, D&C  SURGEON:  Surgeon(s) and Role:    Servando Salina, MD - Primary  PHYSICIAN ASSISTANT:   ASSISTANTS: none   ANESTHESIA:   general Findings: large anterior SM fibroid, pea size right post lateral fibroid, post large endocervical polyp  EBL:  67ml   BLOOD ADMINISTERED:none  DRAINS: none   LOCAL MEDICATIONS USED:  NONE  SPECIMEN:  Source of Specimen:  endometrial curetting with polyp, fibroid resection  DISPOSITION OF SPECIMEN:  PATHOLOGY  COUNTS:  YES  TOURNIQUET:  * No tourniquets in log *  DICTATION: .Other Dictation: Dictation Number [id number]  PLAN OF CARE: Discharge to home after PACU  PATIENT DISPOSITION:  PACU - hemodynamically stable.   Delay start of Pharmacological VTE agent (>24hrs) due to surgical blood loss or risk of bleeding: no

## 2020-03-28 NOTE — Anesthesia Postprocedure Evaluation (Signed)
Anesthesia Post Note  Patient: Kimyra Pressnall  Procedure(s) Performed: DILATATION & CURETTAGE/HYSTEROSCOPY WITH DISSECTION OF SUB MUCOSAL FIBROID,WITH MYOSURE XL, AND ENDOCERVICAL AND ENDOMETRIAL POLYPECTOMY WITH MYOSURE XL (N/A Vagina )     Patient location during evaluation: PACU Anesthesia Type: General Level of consciousness: awake Pain management: pain level controlled Vital Signs Assessment: post-procedure vital signs reviewed and stable Respiratory status: spontaneous breathing Cardiovascular status: stable Postop Assessment: no apparent nausea or vomiting Anesthetic complications: no    Last Vitals:  Vitals:   03/28/20 1515 03/28/20 1600  BP: 122/77 127/81  Pulse: 89 85  Resp: 16 16  Temp:  36.6 C  SpO2: 99% 100%    Last Pain:  Vitals:   03/28/20 1600  TempSrc: Oral  PainSc: 0-No pain                 Sarrah Fiorenza

## 2020-03-28 NOTE — Anesthesia Preprocedure Evaluation (Signed)
Anesthesia Evaluation  Patient identified by MRN, date of birth, ID band Patient awake    Reviewed: Allergy & Precautions, NPO status , Patient's Chart, lab work & pertinent test results  Airway Mallampati: II  TM Distance: >3 FB     Dental   Pulmonary former smoker,    breath sounds clear to auscultation       Cardiovascular negative cardio ROS   Rhythm:Regular Rate:Normal     Neuro/Psych    GI/Hepatic Neg liver ROS, GERD  ,  Endo/Other    Renal/GU negative Renal ROS     Musculoskeletal   Abdominal   Peds  Hematology   Anesthesia Other Findings   Reproductive/Obstetrics                             Anesthesia Physical Anesthesia Plan  ASA: II  Anesthesia Plan: General   Post-op Pain Management:    Induction: Intravenous  PONV Risk Score and Plan: 3 and Ondansetron, Dexamethasone and Midazolam  Airway Management Planned: Oral ETT  Additional Equipment:   Intra-op Plan:   Post-operative Plan: Extubation in OR  Informed Consent: I have reviewed the patients History and Physical, chart, labs and discussed the procedure including the risks, benefits and alternatives for the proposed anesthesia with the patient or authorized representative who has indicated his/her understanding and acceptance.     Dental advisory given  Plan Discussed with: CRNA and Anesthesiologist  Anesthesia Plan Comments:         Anesthesia Quick Evaluation

## 2020-03-28 NOTE — Op Note (Signed)
NAMELIBBEY, STICHT MEDICAL RECORD [id number] ACCOUNT [id number] DATE OF BIRTH:08/13/1976 FACILITY: [facility] LOCATION: [facility] PHYSICIAN:Vaishnav Demartin A. Amran Malter, MD  OPERATIVE REPORT  DATE OF PROCEDURE:  03/28/2020  PREOPERATIVE DIAGNOSES:  Abnormal uterine bleeding, endometrial mass.  PROCEDURE PERFORMED:  Diagnostic hysteroscopy, hysteroscopic resection of submucosal fibroid and endocervical polyp, dilation and curettage.  POSTOPERATIVE DIAGNOSES:  Abnormal uterine bleeding, submucosal fibroid, endocervical/endometrial polyp, dilation and curettage.  ANESTHESIA:  General.  SURGEON:  Servando Salina, MD  ASSISTANT:  None  DESCRIPTION OF PROCEDURE:  Under adequate general anesthesia, the patient was placed in the dorsal lithotomy position.  She was sterilely prepped and draped in usual fashion.  The bladder was catheterized for moderate amount of urine.  Examination under  anesthesia revealed an anteverted uterus.  No adnexal masses could be appreciated.  Bivalve speculum was placed in the vagina.  Single tooth tenaculum was placed on the anterior lip of the cervix.  The cervix was dilated up to #17 [facility] dilator.  A  diagnostic hysteroscope was introduced into the uterine cavity.  On entering the endocervical canal a polypoid lesion was encountered.  Further advancement into the cavity resulted in the finding of a large submucosal anterior fibroid.  The tubal ostia  could barely be seen initially.  Using the MyoSure resectoscope, the anterior fibroid was resected.  There was a smaller posterior right lateral fibroid that was also resected and in pulling back into the endocervical canal the large endocervical/ possible lower uterine segment endometrial   polypoid lesion was also removed.  The tubal ostia could be seen, the right greater than the left.  The endometrial cavity was also resected as well.  When the resection of the fibroid was felt to be adequate all instruments were then  removed from the vagina.  SPECIMEN:  Labeled endometrial curettings with endocervical polyp and fibroid resection was sent to pathology.  FLUID DEFICIT:  [id number] ml  COMPLICATIONS:  None.  ESTIMATED BLOOD LOSS:  About 10 mL.   DISPOSITION:  The patient tolerated the procedure well and was transferred to recovery room in stable condition.  CN/NUANCE  D:03/28/2020 T:03/28/2020 JOB:[id number]

## 2020-03-28 NOTE — Transfer of Care (Signed)
Immediate Anesthesia Transfer of Care Note  Patient: Lydia [name]  Procedure(s) Performed: DILATATION & CURETTAGE/HYSTEROSCOPY WITH DISSECTION OF SUB MUCOSAL FIBROID,WITH MYOSURE XL, AND ENDOCERVICAL AND ENDOMETRIAL POLYPECTOMY WITH MYOSURE XL (N/A Vagina )  Patient Location: PACU  Anesthesia Type:General  Level of Consciousness: awake, alert  and oriented  Airway & Oxygen Therapy: Patient Spontanous Breathing and Patient connected to nasal cannula oxygen  Post-op Assessment: Report given to RN and Post -op Vital signs reviewed and stable  Post vital signs: Reviewed and stable  Last Vitals:  Vitals Value Taken Time  BP 126/77 03/28/20 1431  Temp    Pulse 102 03/28/20 1433  Resp 13 03/28/20 1433  SpO2 91 % 03/28/20 1433  Vitals shown include unvalidated device data.  Last Pain:  Vitals:   03/28/20 1202  TempSrc: Oral  PainSc: 3       Patients Stated Pain Goal: 3 ([id number])  Complications: No apparent anesthesia complications

## 2020-03-28 NOTE — Discharge Instructions (Signed)
CALL  IF TEMP>100.4, NOTHING PER VAGINA X1 WK, CALL IF SOAKING A MAXI  PAD EVERY HOUR OR MORE FREQUENTLY     Post Anesthesia Home Care Instructions  Activity: Get plenty of rest for the remainder of the day. A responsible individual must stay with you for 24 hours following the procedure.  For the next 24 hours, DO NOT: -Drive a car -Paediatric nurse -Drink alcoholic beverages -Take any medication unless instructed by your physician -Make any legal decisions or sign important papers.  Meals: Start with liquid foods such as gelatin or soup. Progress to regular foods as tolerated. Avoid greasy, spicy, heavy foods. If nausea and/or vomiting occur, drink only clear liquids until the nausea and/or vomiting subsides. Call your physician if vomiting continues.  Special Instructions/Symptoms: Your throat may feel dry or sore from the anesthesia or the breathing tube placed in your throat during surgery. If this causes discomfort, gargle with warm salt water. The discomfort should disappear within 24 hours.

## 2020-03-28 NOTE — Anesthesia Procedure Notes (Signed)
Procedure Name: LMA Insertion Date/Time: 03/28/2020 1:44 PM Performed by: Rozell Theiler D, CRNA Pre-anesthesia Checklist: Patient identified, Emergency Drugs available, Suction available and Patient being monitored Patient Re-evaluated:Patient Re-evaluated prior to induction Oxygen Delivery Method: Circle system utilized Preoxygenation: Pre-oxygenation with 100% oxygen Induction Type: IV induction Ventilation: Mask ventilation without difficulty LMA: LMA inserted LMA Size: 4.0 Tube type: Oral Number of attempts: 1 Placement Confirmation: positive ETCO2 and breath sounds checked- equal and bilateral Tube secured with: Tape Dental Injury: Teeth and Oropharynx as per pre-operative assessment

## 2020-03-28 NOTE — H&P (Signed)
Lydia [name] is an 44 y.o. female. G1P0010 [facility] here for surgical management of AUB, endometrial thickening ? Polyp vs SM fibroid  Pertinent Gynecological History: Menses: flow is excessive with use of 6 pads or tampons on heaviest days Bleeding: dysfunctional uterine bleeding Contraception: none DES exposure: denies Blood transfusions: none Sexually transmitted diseases: no past history Previous GYN Procedures: DNC  Last mammogram: normal Date: 01/2020 Last pap: normal Date: 2020 OB History: G1P0010   Menstrual History: Menarche age: n/Lydia Patient's last menstrual period was 01/22/2020.    Past Medical History:  Diagnosis Date  . Abnormal uterine bleeding (AUB)   . ADHD   . Bipolar disorder ([place])   . GERD (gastroesophageal reflux disease)   . Headache    migraines  . Heat rash    on hips x 1 week using hydrocortisone cream and ice pack prn  . OCD (obsessive compulsive disorder)     Past Surgical History:  Procedure Laterality Date  . HEMORRHOID SURGERY    . LAPAROSCOPIC APPENDECTOMY N/Lydia 12/23/2015   Procedure: APPENDECTOMY LAPAROSCOPIC;  Surgeon: Alphonsa Overall, MD;  Location: [facility];  Service: General;  Laterality: N/Lydia;    History reviewed. No pertinent family history.  Social History:  reports that she quit smoking about 21 years ago. Her smoking use included cigarettes. She has Lydia 2.50 pack-year smoking history. She quit smokeless tobacco use about 24 years ago.  Her smokeless tobacco use included snuff. She reports current alcohol use. She reports that she does not use drugs.  Allergies:  Allergies  Allergen Reactions  . Other Other (See Comments)    Walnut - stinging mouth sores    Medications Prior to Admission  Medication Sig Dispense Refill Last Dose  . ALPRAZolam (XANAX) 1 MG tablet Take 1 mg by mouth at bedtime.    03/27/2020 at Unknown time  . amphetamine-dextroamphetamine (ADDERALL) 20 MG tablet Take 20 mg by mouth every morning.    03/27/2020 at Unknown time  .  buPROPion (WELLBUTRIN XL) 300 MG 24 hr tablet Take 300 mg by mouth every morning.    03/28/2020 at 0930  . FLUoxetine (PROZAC) 20 MG capsule Take 20 mg by mouth every morning.    03/28/2020 at 0930  . hydrocortisone cream 0.5 % Apply 1 application topically as needed for itching. To heat rash on hips   03/27/2020 at Unknown time  . lamoTRIgine (LAMICTAL) 150 MG tablet Take 150 mg by mouth at bedtime.   03/27/2020 at Unknown time  . Multiple Vitamin (MULTIVITAMIN WITH MINERALS) TABS tablet Take 1 tablet by mouth daily.   03/27/2020 at Unknown time  . Norgestimate-Eth Estradiol (SPRINTEC 28 PO) Take by mouth every evening.   03/27/2020 at Unknown time  . QUEtiapine (SEROQUEL XR) 300 MG 24 hr tablet Take 300 mg by mouth at bedtime.    03/27/2020 at Unknown time  . Wheat Dextrin (BENEFIBER) POWD Take 2 Scoops by mouth daily. Mixed in with coffee   03/27/2020 at Unknown time  . SUMAtriptan (IMITREX) 25 MG tablet Take 25 mg by mouth See admin instructions. Take 25 mg at onset of migraine.  May repeat in 2 hours if needed.   More than Lydia month at Unknown time    Review of Systems  All other systems reviewed and are negative.   Blood pressure 126/86, pulse 93, temperature (!) 97.1 F (36.2 C), temperature source Oral, resp. rate 14, height 5\' 5"  (1.651 m), weight 67.2 kg, last menstrual period 01/22/2020, SpO2 100 %. Physical Exam  Constitutional: She is oriented to person, place, and time. She appears well-developed and well-nourished.  HENT:  Head: Atraumatic.  Eyes: EOM are normal.  Cardiovascular: Normal rate and regular rhythm.  Respiratory: Breath sounds normal.  GI: Soft.  Genitourinary:    Genitourinary Comments: Cervix closed Uterus AV  adnexa nl   Musculoskeletal:     Cervical back: Neck supple.  Neurological: She is alert and oriented to person, place, and time.  Skin: Skin is warm and dry.  Psychiatric: She has Lydia normal mood and affect.    Results for orders placed or performed during  the hospital encounter of 03/28/20 (from the past 24 hour(s))  Pregnancy, urine POC     Status: None   Collection Time: 03/28/20 11:55 AM  Result Value Ref Range   Preg Test, Ur NEGATIVE NEGATIVE  CBC     Status: Abnormal   Collection Time: 03/28/20 12:05 PM  Result Value Ref Range   WBC 8.4 4.0 - 10.5 K/uL   RBC 4.14 3.87 - 5.11 MIL/uL   Hemoglobin 11.9 (L) 12.0 - 15.0 g/dL   HCT 37.1 36.0 - 46.0 %   MCV 89.6 80.0 - 100.0 fL   MCH 28.7 26.0 - 34.0 pg   MCHC 32.1 30.0 - 36.0 g/dL   RDW 15.6 (H) 11.5 - 15.5 %   Platelets 365 150 - 400 K/uL   nRBC 0.0 0.0 - 0.2 %    No results found.  Assessment/Plan: DUB Endometrial thickening on sonogram ? endom polyp vs SM fibroid P) sx hysteroscopy, D&C, resection of endometrial polyp, SM if present. Risk of surgery reviewed including infection, bleeding, injury to surrounding organ structures, fluid overload thermal injury, uterine perforation and its risk, internal scar tissue. All ? answered  Lydia [name] Lydia [name] 03/28/2020, 1:28 PM

## 2020-03-29 LAB — SURGICAL PATHOLOGY

## 2020-04-01 ENCOUNTER — Ambulatory Visit: Payer: Managed Care, Other (non HMO) | Attending: Internal Medicine

## 2020-04-01 DIAGNOSIS — Z23 Encounter for immunization: Secondary | ICD-10-CM

## 2020-04-01 NOTE — Progress Notes (Signed)
   Covid-19 Vaccination Clinic  Name:  Lydia [name]    MRN: [id number] DOB: [birth year]  04/01/2020  [name] was observed post Covid-19 immunization for 15 minutes without incident. She was provided with Vaccine Information Sheet and instruction to access the V-Safe system.   [name] was instructed to call 911 with any severe reactions post vaccine: Marland Kitchen Difficulty breathing  . Swelling of face and throat  . A fast heartbeat  . A bad rash all over body  . Dizziness and weakness   Immunizations Administered    Name Date Dose VIS Date Route   Pfizer COVID-19 Vaccine 04/01/2020  4:19 PM 0.3 mL 01/24/2019 Intramuscular   Manufacturer: [place]   Lot: [id number]   [place]: [id number]

## 2020-06-12 ENCOUNTER — Ambulatory Visit
Admission: RE | Admit: 2020-06-12 | Discharge: 2020-06-12 | Disposition: A | Discharge status: Home / Self Care | Payer: Managed Care, Other (non HMO) | Source: Ambulatory Visit | Attending: Internal Medicine | Admitting: Internal Medicine

## 2020-06-12 ENCOUNTER — Other Ambulatory Visit: Payer: Self-pay | Admitting: Internal Medicine

## 2020-06-12 ENCOUNTER — Other Ambulatory Visit: Payer: Self-pay

## 2020-06-12 DIAGNOSIS — N6310 Unspecified lump in the right breast, unspecified quadrant: Secondary | ICD-10-CM

## 2020-06-12 DIAGNOSIS — N6320 Unspecified lump in the left breast, unspecified quadrant: Secondary | ICD-10-CM

## 2020-08-28 IMAGING — US US BREAST*R* LIMITED INC AXILLA
1 series · 6 of 6 positions shown · non-contrast
Comparison: Previous exams.

CLINICAL DATA: Short-term follow-up for probably benign bilateral
breast masses.

EXAM:
ULTRASOUND OF THE BILATERAL BREAST

[Series 1: us breast*right* limited inc axilla · 0.06mm/px · 6 of 6 slices shown]
[im 1/6]
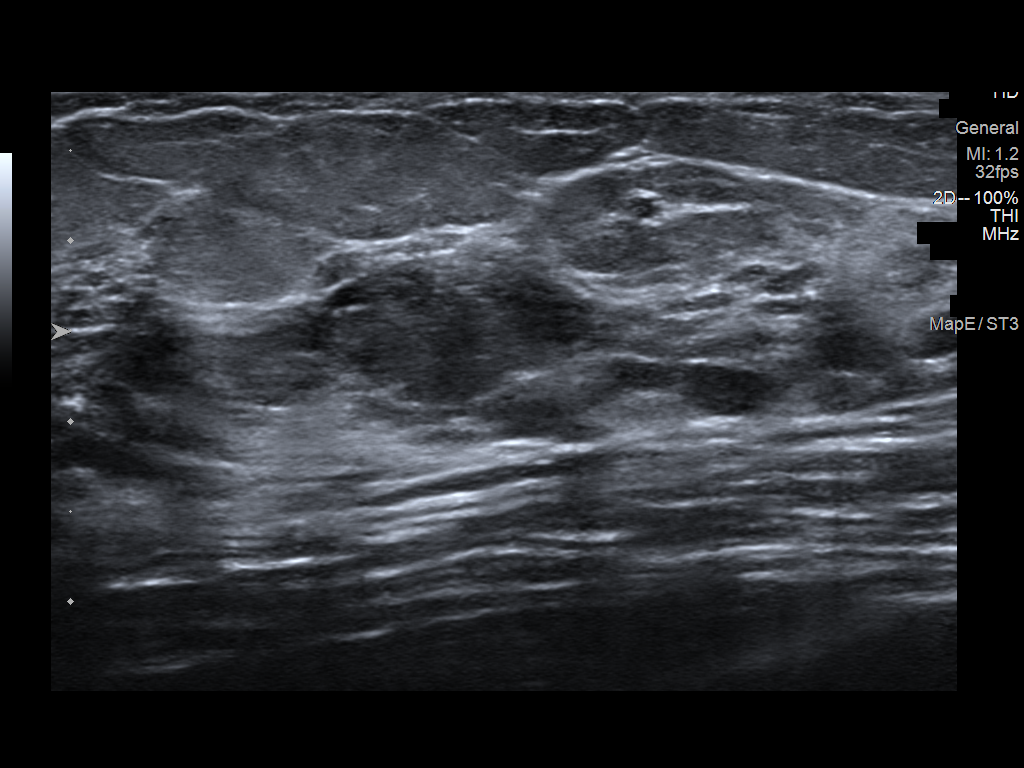
[im 2/6]
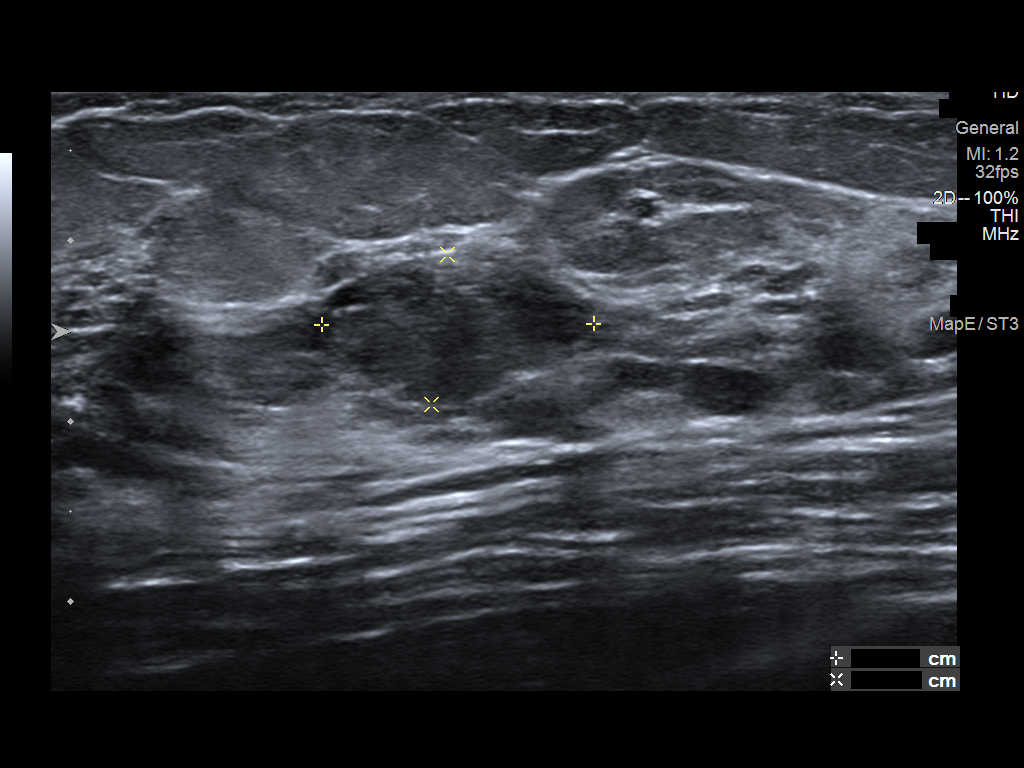
[im 3/6]
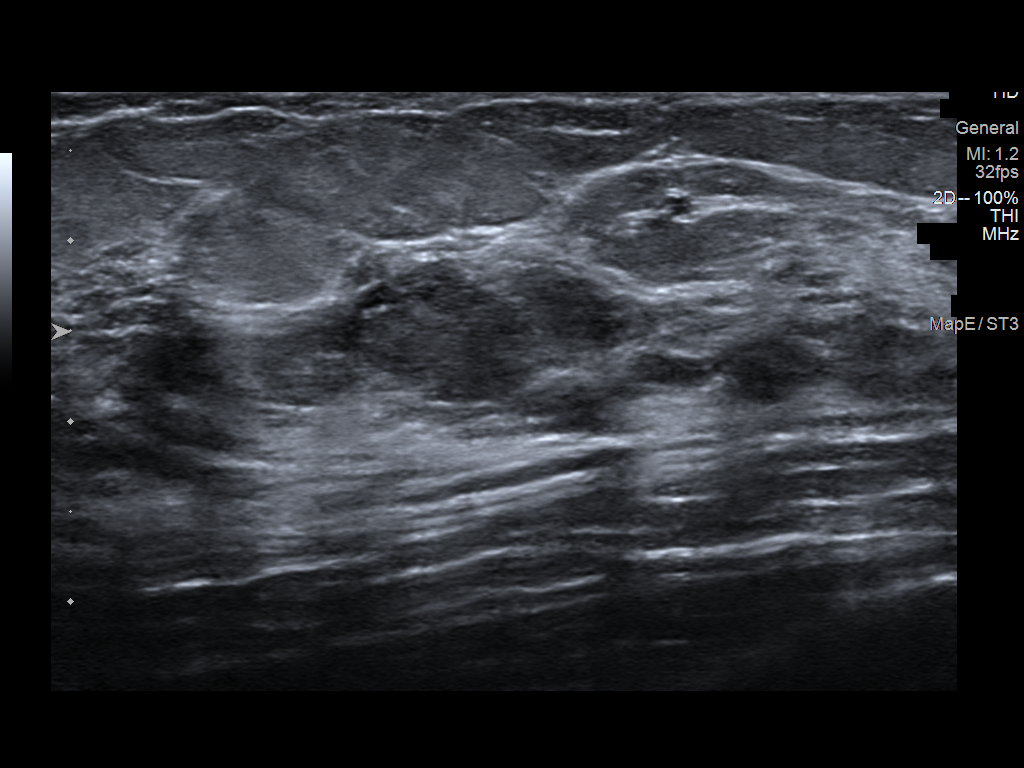
[im 4/6]
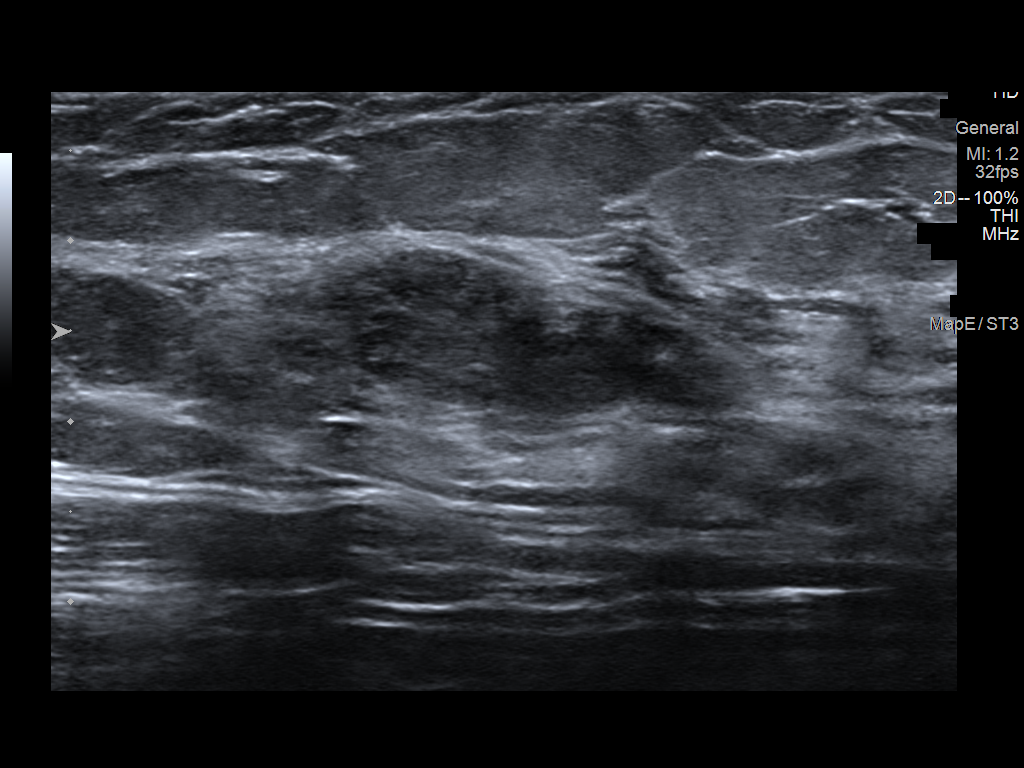
[im 5/6]
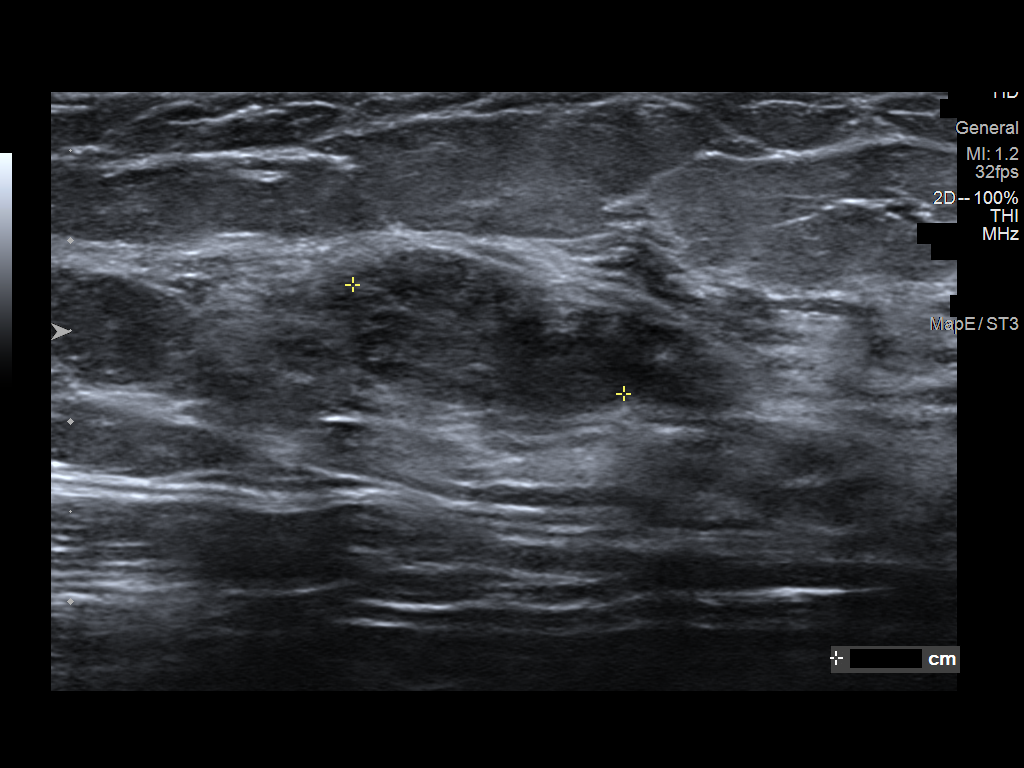
[im 6/6]
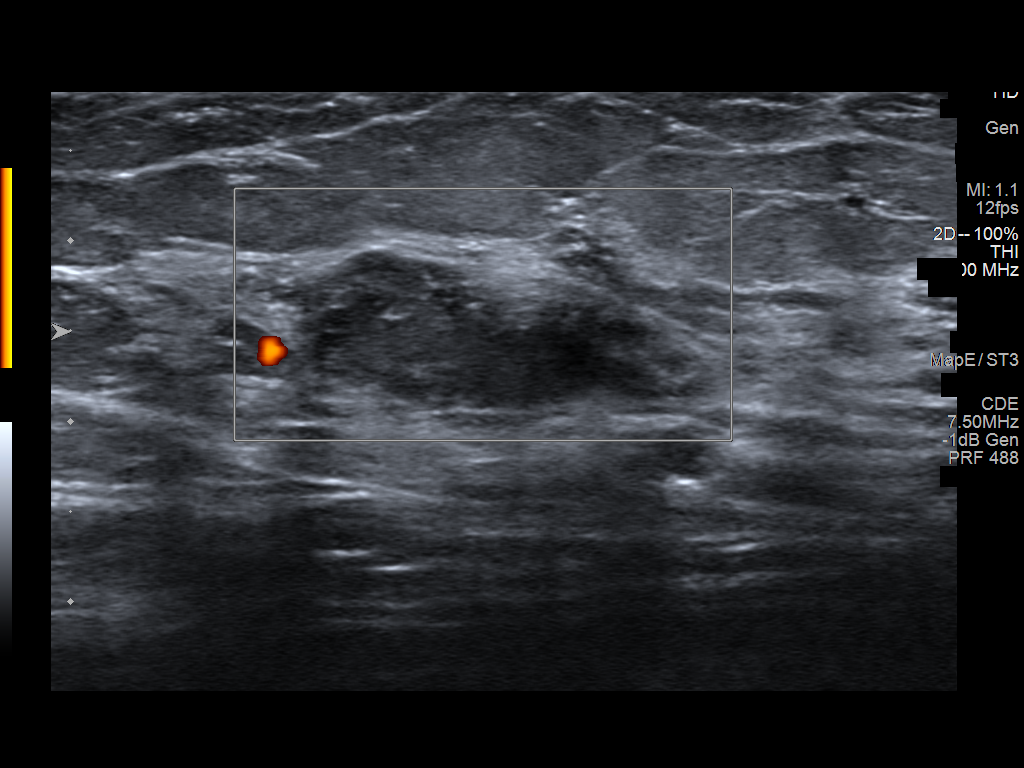

[6 of 6 positions shown; findings below may reference images not displayed]

FINDINGS: Targeted ultrasound of the right breast was performed. The oval
circumscribed mildly hypoechoic mass in the right breast at 1
o'clock 5 cm from nipple measures 1.5 x 0.8 by 1.6 cm, stable to
slightly smaller in size previously measuring 1.8 x 0.9 x 1.7 cm.

Targeted ultrasound of the left breast was performed. The oval
circumscribed mildly hypoechoic mass in the left breast at 2 o'clock
4 cm from nipple measures 1.6 x 0.8 x 1.4 cm, unchanged. The
additional round near anechoic mass with posterior acoustic
enhancement in the left breast at 2 o'clock 2 cm from nipple
measures 0.9 x 0.8 x 0.9 cm, also unchanged.
IMPRESSION: 1. Stable to slightly smaller size of probably benign right breast
mass.

2.  Stable probably benign left breast masses.

RECOMMENDATION:
Recommend bilateral diagnostic mammography in 6 months with
bilateral breast ultrasound to demonstrate 1 year of stability of
the probably benign bilateral breast masses.

I have discussed the findings and recommendations with the patient.
If applicable, a reminder letter will be sent to the patient
regarding the next appointment.

BI-RADS CATEGORY  3: Probably benign.

## 2020-12-17 ENCOUNTER — Other Ambulatory Visit: Payer: Managed Care, Other (non HMO)

## 2021-01-02 ENCOUNTER — Other Ambulatory Visit: Payer: Self-pay

## 2021-01-02 ENCOUNTER — Ambulatory Visit
Admission: RE | Admit: 2021-01-02 | Discharge: 2021-01-02 | Disposition: A | Discharge status: Home / Self Care | Payer: Managed Care, Other (non HMO) | Source: Ambulatory Visit | Attending: Internal Medicine | Admitting: Internal Medicine

## 2021-01-02 DIAGNOSIS — N6310 Unspecified lump in the right breast, unspecified quadrant: Secondary | ICD-10-CM

## 2021-01-02 DIAGNOSIS — N6320 Unspecified lump in the left breast, unspecified quadrant: Secondary | ICD-10-CM

## 2021-03-20 IMAGING — US US BREAST*R* LIMITED INC AXILLA
1 series · 7 of 7 positions shown · non-contrast
Comparison: Previous exam(s).

CLINICAL DATA: Patient presents for follow-up of probably benign
bilateral breast masses.

EXAM:
DIGITAL DIAGNOSTIC BILATERAL MAMMOGRAM WITH CAD AND TOMOSYNTHESIS
ULTRASOUND BILATERAL BREAST
TECHNIQUE: Bilateral digital diagnostic mammography and breast tomosynthesis
was performed. Digital images of the breasts were evaluated with
computer-aided detection. Targeted ultrasound examination of the
Bilateral breast was performed.

[Series 1: us breast*right* limited inc axilla · 0.06mm/px · 7 of 7 slices shown]
[im 1/7]
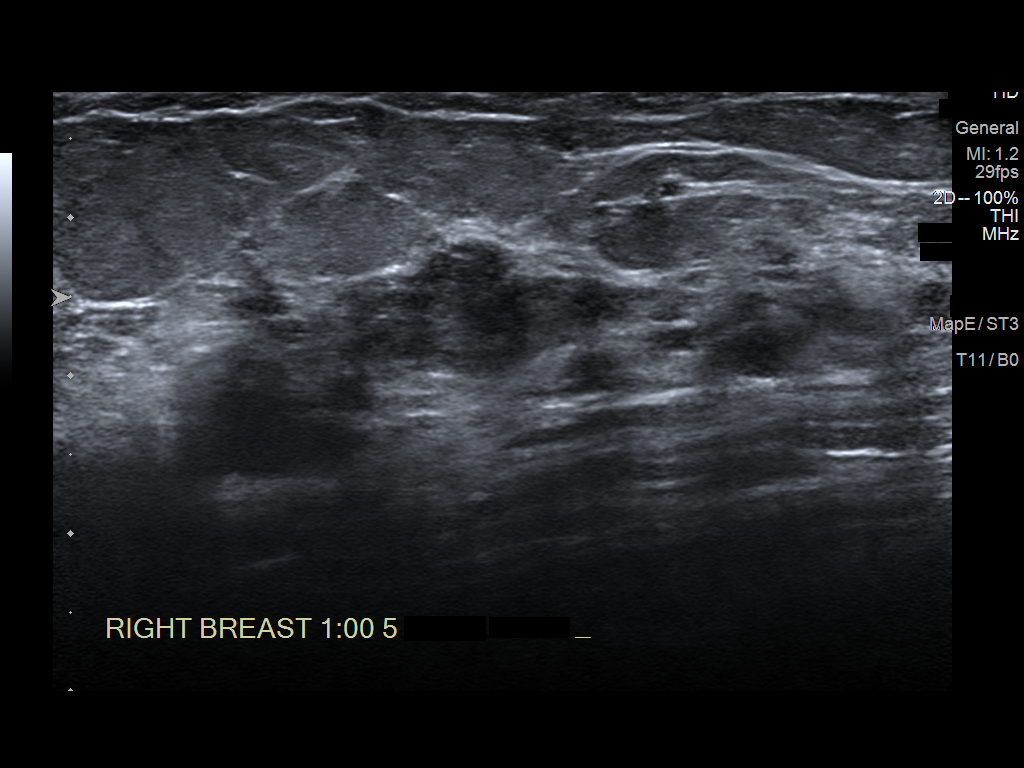
[im 2/7]
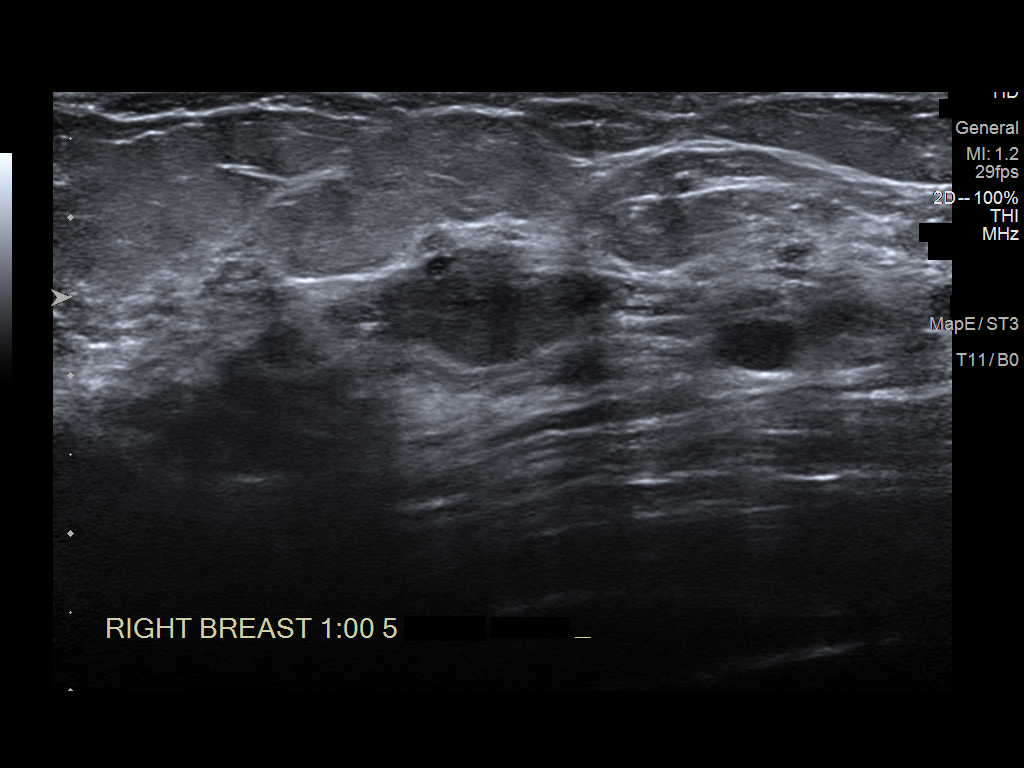
[im 3/7]
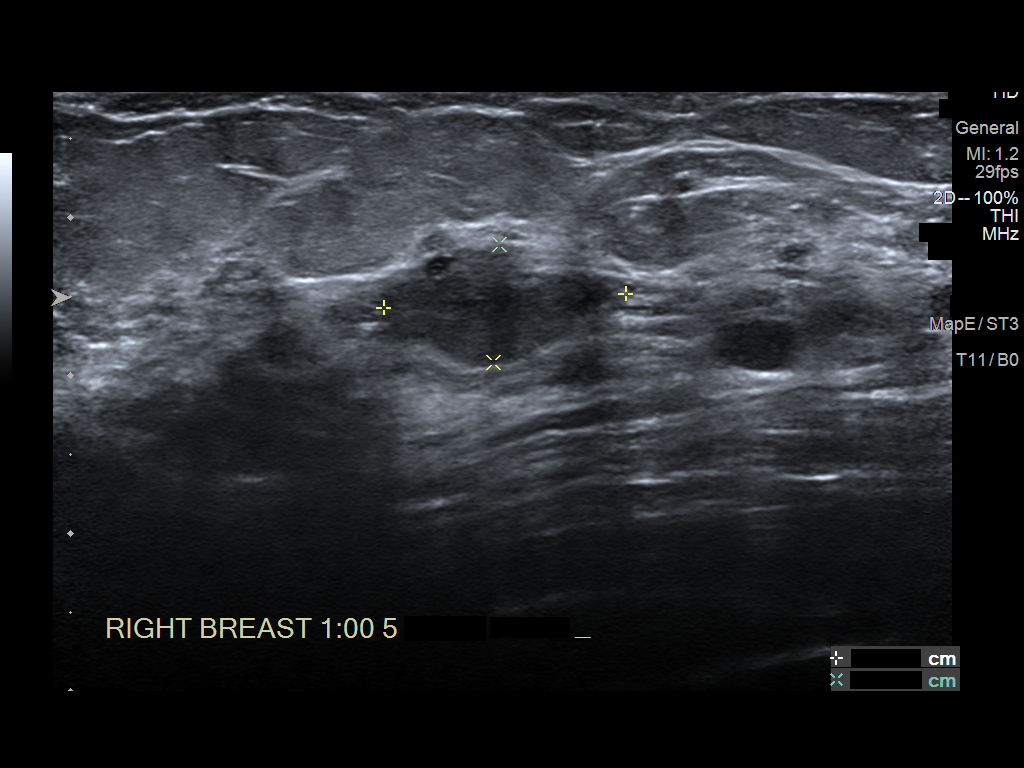
[im 4/7]
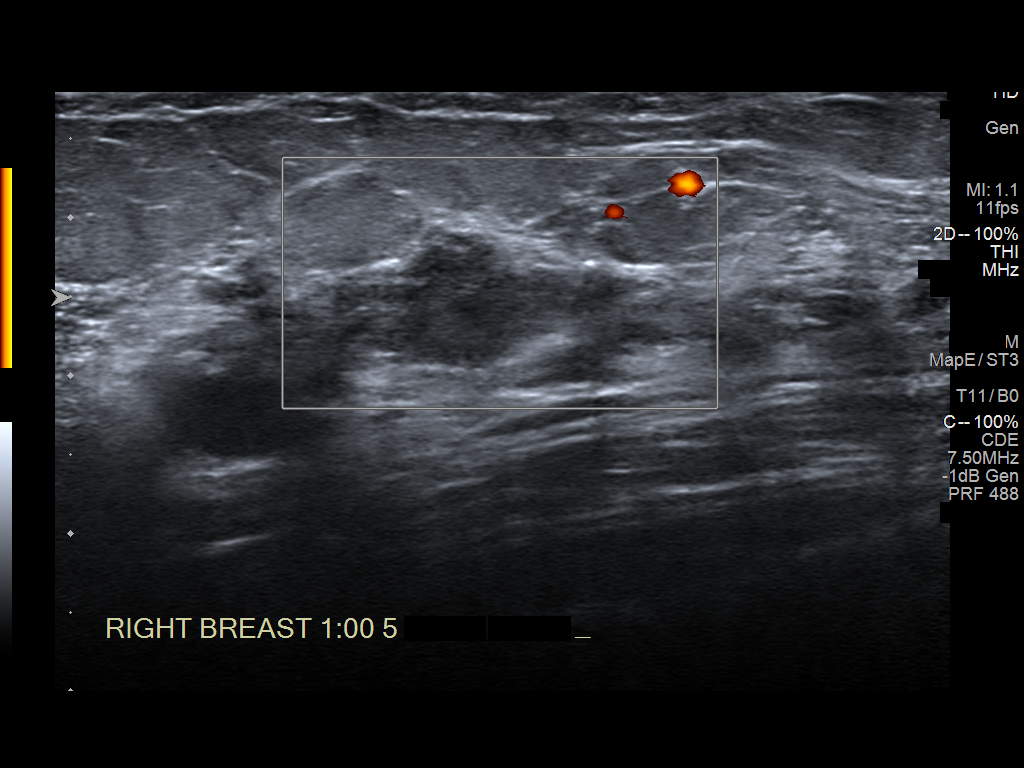
[im 5/7]
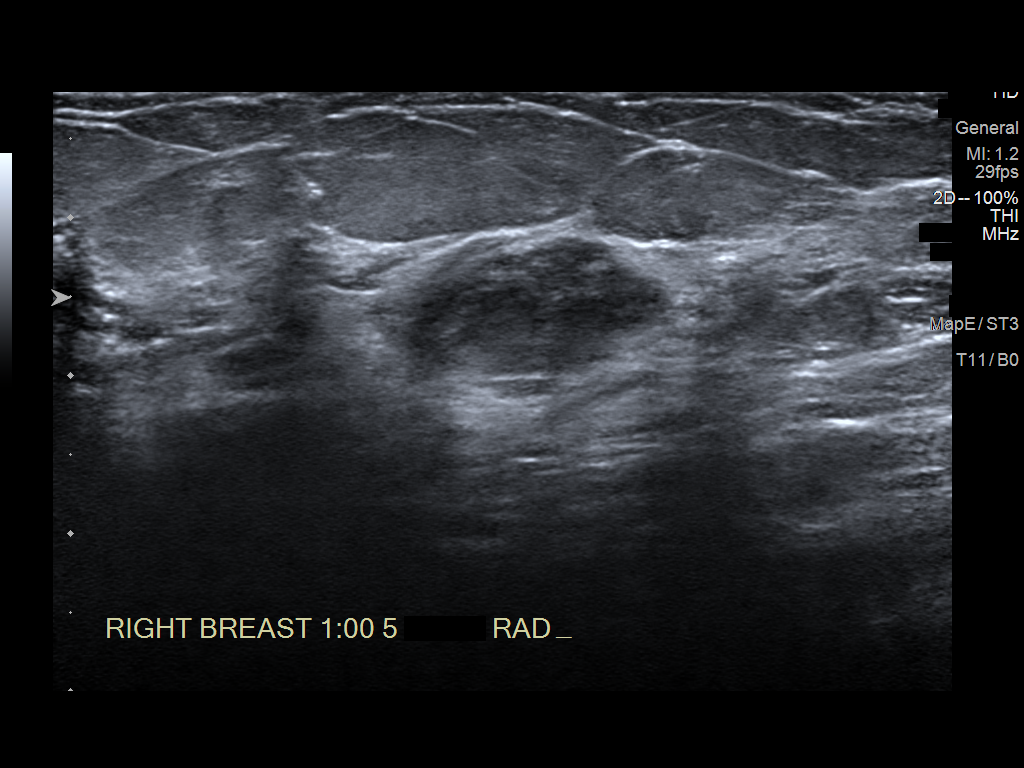
[im 6/7]
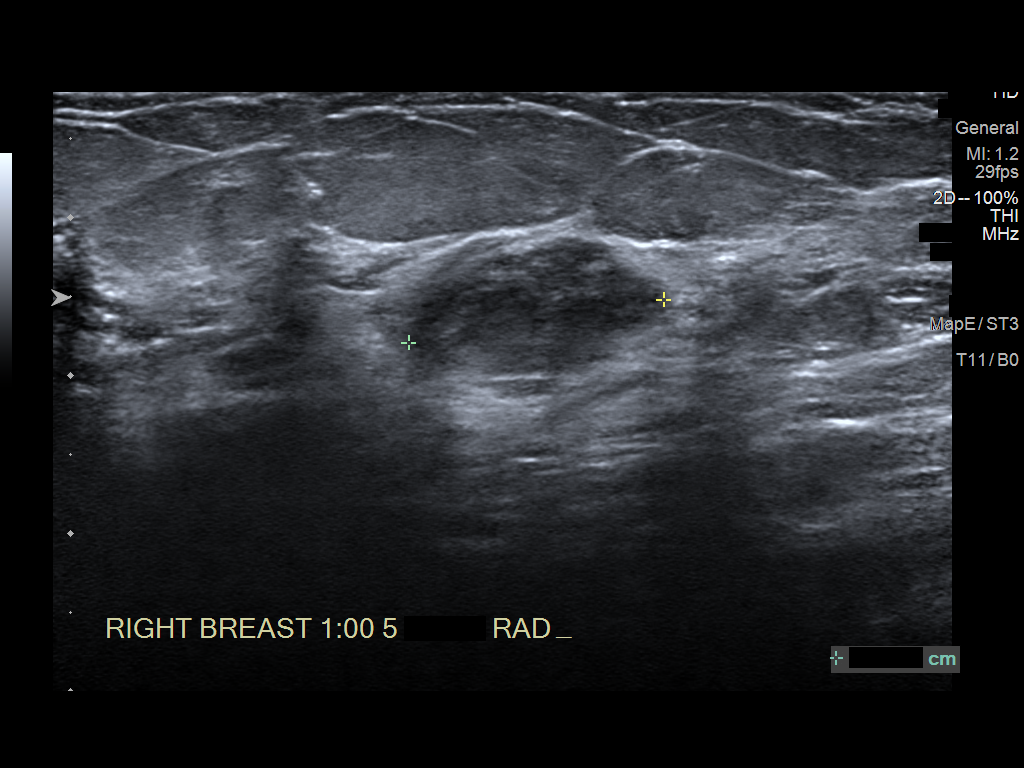
[im 7/7]
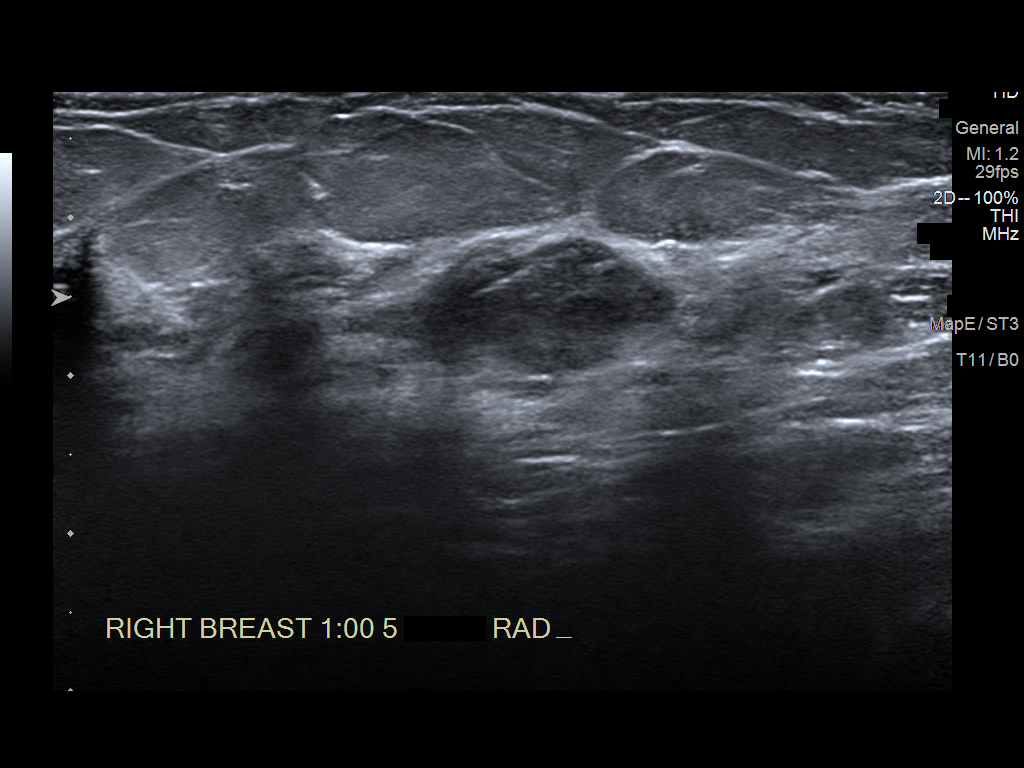

[7 of 7 positions shown; findings below may reference images not displayed]

ACR Breast Density Category c: The breast tissue is heterogeneously
dense, which may obscure small masses.
FINDINGS: No concerning masses, calcifications or nonsurgical distortion
identified within the right or left breast.

Targeted ultrasound is performed, showing a stable 1.5 x 0.8 x
cm oval hypoechoic mass right breast 1 o'clock position 5 cm from
nipple.

Within the left breast 2 o'clock position 4 cm from nipple there is
a stable 1.6 x 1.4 x 0.8 cm oval circumscribed hypoechoic mass.

There is an adjacent 0.8 x 1.0 x 1.0 cm mildly complicated cyst.

Within the left breast 2 o'clock position 2 cm from nipple there is
a stable 1.0 x 1.0 x 0.8 cm probable complicated cyst.
IMPRESSION: Stable probably benign bilateral breast masses.

RECOMMENDATION:
Bilateral diagnostic mammography and breast ultrasound in 12 months.

I have discussed the findings and recommendations with the patient.
If applicable, a reminder letter will be sent to the patient
regarding the next appointment.

BI-RADS CATEGORY  3: Probably benign.

## 2021-10-10 ENCOUNTER — Other Ambulatory Visit: Payer: Self-pay | Admitting: Obstetrics and Gynecology

## 2021-10-10 DIAGNOSIS — D250 Submucous leiomyoma of uterus: Secondary | ICD-10-CM

## 2021-10-14 ENCOUNTER — Other Ambulatory Visit: Payer: Self-pay | Admitting: Interventional Radiology

## 2021-10-14 ENCOUNTER — Other Ambulatory Visit: Payer: Self-pay | Admitting: *Deleted

## 2021-10-14 ENCOUNTER — Encounter: Payer: Self-pay | Admitting: *Deleted

## 2021-10-14 ENCOUNTER — Ambulatory Visit
Admission: RE | Admit: 2021-10-14 | Discharge: 2021-10-14 | Disposition: A | Discharge status: Home / Self Care | Payer: Managed Care, Other (non HMO) | Source: Ambulatory Visit | Attending: Obstetrics and Gynecology | Admitting: Obstetrics and Gynecology

## 2021-10-14 DIAGNOSIS — D250 Submucous leiomyoma of uterus: Secondary | ICD-10-CM

## 2021-10-14 HISTORY — PX: IR RADIOLOGIST EVAL & MGMT: IMG5224

## 2021-10-14 NOTE — Consult Note (Signed)
Chief Complaint:  Symptomatic uterine fibroids, dysfunctional uterine bleeding  Referring Physician(s): Cousins,Sheronette  History of Present Illness: Lydia [name] is a 45 y.o. female with known uterine fibroids and dysfunctional uterine bleeding.  She has very heavy periods with passes of blood clots.  Her menstrual cycle can last approximately 5 days with 4 heavy days.  She is currently on birth control pills and Depo-Provera shots every 3 months.  She has had breakthrough bleeding despite the hormone therapies.  No future pregnancy plans.  Previous fibroid surgery was 03/28/2020 when she had a D&C and hysteroscopic fibroid resection.  Pathology was benign.  No recent GYN infections requiring antibiotics.  No recent illness or fevers.  Office ultrasound confirms several small fibroids and a mildly enlarged uterus measuring up to 9 cm.  Past Medical History:  Diagnosis Date   Abnormal uterine bleeding (AUB)    ADHD    Bipolar disorder (HCC)    GERD (gastroesophageal reflux disease)    Headache    migraines   Heat rash    on hips x 1 week using hydrocortisone cream and ice pack prn   OCD (obsessive compulsive disorder)     Past Surgical History:  Procedure Laterality Date   DILATATION & CURETTAGE/HYSTEROSCOPY WITH MYOSURE N/A 03/28/2020   Procedure: DILATATION & CURETTAGE/HYSTEROSCOPY WITH DISSECTION OF SUB MUCOSAL FIBROID,WITH MYOSURE XL, AND ENDOCERVICAL AND ENDOMETRIAL POLYPECTOMY WITH MYOSURE XL;  Surgeon: Servando Salina, MD;  Location: [place];  Service: Gynecology;  Laterality: N/A;   HEMORRHOID SURGERY     LAPAROSCOPIC APPENDECTOMY N/A 12/23/2015   Procedure: APPENDECTOMY LAPAROSCOPIC;  Surgeon: Alphonsa Overall, MD;  Location: [facility];  Service: General;  Laterality: N/A;    Allergies: Other  Medications: Prior to Admission medications   Medication Sig Start Date End Date Taking? Authorizing Provider  ALPRAZolam Duanne Moron) 1 MG tablet Take 1 mg by  mouth at bedtime.  12/29/19   [provider]  amphetamine-dextroamphetamine (ADDERALL) 20 MG tablet Take 20 mg by mouth every morning.  12/11/15   [provider]  buPROPion (WELLBUTRIN XL) 300 MG 24 hr tablet Take 300 mg by mouth every morning.  09/17/15   [provider]  FLUoxetine (PROZAC) 20 MG capsule Take 20 mg by mouth every morning.  09/17/15   [provider]  hydrocortisone cream 0.5 % Apply 1 application topically as needed for itching. To heat rash on hips    [provider]  lamoTRIgine (LAMICTAL) 150 MG tablet Take 150 mg by mouth at bedtime. 11/17/19   [provider]  Multiple Vitamin (MULTIVITAMIN WITH MINERALS) TABS tablet Take 1 tablet by mouth daily.    [provider]  Norgestimate-Eth Estradiol (SPRINTEC 28 PO) Take by mouth every evening.    [provider]  QUEtiapine (SEROQUEL XR) 300 MG 24 hr tablet Take 300 mg by mouth at bedtime.  11/13/15   [provider]  SUMAtriptan (IMITREX) 25 MG tablet Take 25 mg by mouth See admin instructions. Take 25 mg at onset of migraine.  May repeat in 2 hours if needed. 12/19/15   [provider]  Wheat Dextrin (BENEFIBER) POWD Take 2 Scoops by mouth daily. Mixed in with coffee    [provider]     No family history on file.  Social History   Socioeconomic History   Marital status: Married    Spouse name: Not on file   Number of children: Not on file   Years of education: Not on  file   Highest education level: Not on file  Occupational History   Not on file  Tobacco Use   Smoking status: Former    Packs/day: 0.25    Years: 10.00    Pack years: 2.50    Types: Cigarettes    Quit date: 12/22/1998    Years since quitting: 22.8   Smokeless tobacco: Former    Types: Snuff    Quit date: 12/23/1995  Vaping Use   Vaping Use: Never used  Substance and Sexual Activity   Alcohol use: Yes    Comment: very rare   Drug use: No    Sexual activity: Not on file  Other Topics Concern   Not on file  Social History Narrative   Not on file   Social Determinants of Health   Financial Resource Strain: Not on file  Food Insecurity: Not on file  Transportation Needs: Not on file  Physical Activity: Not on file  Stress: Not on file  Social Connections: Not on file     Review of Systems: A 12 point ROS discussed and pertinent positives are indicated in the HPI above.  All other systems are negative.  Review of Systems  Vital Signs: BP (!) 146/86 (BP Location: Left Arm)   Pulse (!) 117   SpO2 96%   Physical Exam Constitutional:      General: She is not in acute distress.    Appearance: She is normal weight. She is not ill-appearing or toxic-appearing.  Eyes:     General: No scleral icterus.    Conjunctiva/sclera: Conjunctivae normal.  Cardiovascular:     Rate and Rhythm: Regular rhythm. Tachycardia present.     Pulses: Normal pulses.     Heart sounds: Normal heart sounds.  Pulmonary:     Effort: Pulmonary effort is normal.     Breath sounds: Normal breath sounds.  Abdominal:     General: Abdomen is flat. Bowel sounds are normal. There is no distension.     Palpations: Abdomen is soft.     Tenderness: There is no abdominal tenderness.  Skin:    General: Skin is warm and dry.     Coloration: Skin is not jaundiced.  Neurological:     General: No focal deficit present.     Mental Status: Mental status is at baseline.  Psychiatric:        Mood and Affect: Mood normal.        Thought Content: Thought content normal.        Judgment: Judgment normal.     Imaging: No results found.  Labs:  CBC: No results for input(s): WBC, HGB, HCT, PLT in the last 8760 hours.  COAGS: No results for input(s): INR, APTT in the last 8760 hours.  BMP: No results for input(s): NA, K, CL, CO2, GLUCOSE, BUN, CALCIUM, CREATININE, GFRNONAA, GFRAA in the last 8760 hours.  Invalid input(s): CMP  LIVER FUNCTION  TESTS: No results for input(s): BILITOT, AST, ALT, ALKPHOS, PROT, ALBUMIN in the last 8760 hours.   Assessment and Plan:  Symptomatic uterine fibroids with chronic progressive dysfunctional uterine bleeding.  Bleeding has been controlled with birth control pills and Depo-Provera shots.  She now has had breakthrough bleeding on the Depo-Provera.  Previously she has required D&C and hysteroscopic resection of submucosal fibroid to control bleeding.  She now presents to consider uterine fibroid embolization.  The fibroid embolization procedure, risk, benefits and alternatives were all reviewed.  The procedure was described in detail including the overnight  recovery, home recovery phase, expected goals and outcomes.  She has a clear understanding of the procedure.  All questions were addressed.  After discussion she would like to proceed with a work-up including a pelvic MRI.     plan: Schedule for pelvic MRI to assess fibroid anatomy for embolization.  Anticipate scheduling the patient electively in the next few months at [facility].  Thank you for this interesting consult.  I greatly enjoyed meeting Elnita Surprenant and look forward to participating in their care.  A copy of this report was sent to the requesting provider on this date.  Electronically Signed: Greggory Keen 10/14/2021, 11:54 AM   I spent a total of  40 Minutes   in face to face in clinical consultation, greater than 50% of which was counseling/coordinating care for with uterine fibroids and abnormal bleeding.

## 2021-12-10 ENCOUNTER — Other Ambulatory Visit (HOSPITAL_COMMUNITY): Payer: Self-pay | Admitting: Interventional Radiology

## 2021-12-10 DIAGNOSIS — D259 Leiomyoma of uterus, unspecified: Secondary | ICD-10-CM

## 2021-12-29 ENCOUNTER — Other Ambulatory Visit: Payer: Self-pay | Admitting: Radiology

## 2021-12-30 ENCOUNTER — Other Ambulatory Visit (HOSPITAL_COMMUNITY): Payer: Self-pay | Admitting: Physician Assistant

## 2021-12-31 ENCOUNTER — Observation Stay (HOSPITAL_COMMUNITY)
Admission: RE | Admit: 2021-12-31 | Discharge: 2022-01-01 | Disposition: A | Discharge status: Home / Self Care | Payer: Managed Care, Other (non HMO) | Source: Ambulatory Visit | Attending: Interventional Radiology | Admitting: Interventional Radiology

## 2021-12-31 ENCOUNTER — Other Ambulatory Visit (HOSPITAL_COMMUNITY): Payer: Self-pay | Admitting: Interventional Radiology

## 2021-12-31 ENCOUNTER — Ambulatory Visit (HOSPITAL_COMMUNITY)
Admission: RE | Admit: 2021-12-31 | Discharge: 2021-12-31 | Disposition: A | Discharge status: Home / Self Care | Payer: Managed Care, Other (non HMO) | Source: Ambulatory Visit | Attending: Interventional Radiology | Admitting: Interventional Radiology

## 2021-12-31 ENCOUNTER — Other Ambulatory Visit: Payer: Self-pay

## 2021-12-31 ENCOUNTER — Encounter (HOSPITAL_COMMUNITY): Payer: Self-pay

## 2021-12-31 VITALS — BP 120/69 | HR 105 | Temp 97.7°F | Resp 18 | Ht 65.0 in | Wt 160.0 lb

## 2021-12-31 DIAGNOSIS — D250 Submucous leiomyoma of uterus: Secondary | ICD-10-CM | POA: Diagnosis present

## 2021-12-31 DIAGNOSIS — D259 Leiomyoma of uterus, unspecified: Secondary | ICD-10-CM

## 2021-12-31 DIAGNOSIS — F909 Attention-deficit hyperactivity disorder, unspecified type: Secondary | ICD-10-CM | POA: Diagnosis not present

## 2021-12-31 DIAGNOSIS — K219 Gastro-esophageal reflux disease without esophagitis: Secondary | ICD-10-CM | POA: Insufficient documentation

## 2021-12-31 DIAGNOSIS — G43909 Migraine, unspecified, not intractable, without status migrainosus: Secondary | ICD-10-CM | POA: Diagnosis not present

## 2021-12-31 DIAGNOSIS — F429 Obsessive-compulsive disorder, unspecified: Secondary | ICD-10-CM | POA: Diagnosis not present

## 2021-12-31 DIAGNOSIS — F319 Bipolar disorder, unspecified: Secondary | ICD-10-CM | POA: Diagnosis not present

## 2021-12-31 DIAGNOSIS — Z20822 Contact with and (suspected) exposure to covid-19: Secondary | ICD-10-CM | POA: Diagnosis not present

## 2021-12-31 DIAGNOSIS — Z01818 Encounter for other preprocedural examination: Secondary | ICD-10-CM

## 2021-12-31 HISTORY — PX: IR EMBO TUMOR ORGAN ISCHEMIA INFARCT INC GUIDE ROADMAPPING: IMG5449

## 2021-12-31 HISTORY — PX: IR ANGIOGRAM SELECTIVE EACH ADDITIONAL VESSEL: IMG667

## 2021-12-31 HISTORY — PX: IR ANGIOGRAM PELVIS SELECTIVE OR SUPRASELECTIVE: IMG661

## 2021-12-31 HISTORY — PX: IR US GUIDE VASC ACCESS RIGHT: IMG2390

## 2021-12-31 HISTORY — PX: IR US GUIDE VASC ACCESS LEFT: IMG2389

## 2021-12-31 LAB — BASIC METABOLIC PANEL
Anion gap: 9 (ref 5–15)
BUN: 11 mg/dL (ref 6–20)
CO2: 20 mmol/L — ABNORMAL LOW (ref 22–32)
Calcium: 8.9 mg/dL (ref 8.9–10.3)
Chloride: 106 mmol/L (ref 98–111)
Creatinine, Ser: 1.19 mg/dL — ABNORMAL HIGH (ref 0.44–1.00)
GFR, Estimated: 57 mL/min — ABNORMAL LOW (ref >60)
Glucose, Bld: 105 mg/dL — ABNORMAL HIGH (ref 70–99)
Potassium: 3.4 mmol/L — ABNORMAL LOW (ref 3.5–5.1)
Sodium: 135 mmol/L (ref 135–145)

## 2021-12-31 LAB — CBC WITH DIFFERENTIAL/PLATELET
Abs Immature Granulocytes: 0.02 10*3/uL (ref 0.00–0.07)
Basophils Absolute: 0.1 10*3/uL (ref 0.0–0.1)
Basophils Relative: 1 %
Eosinophils Absolute: 0.1 10*3/uL (ref 0.0–0.5)
Eosinophils Relative: 1 %
HCT: 39.8 % (ref 36.0–46.0)
Hemoglobin: 13.1 g/dL (ref 12.0–15.0)
Immature Granulocytes: 0 %
Lymphocytes Relative: 27 %
Lymphs Abs: 2.2 10*3/uL (ref 0.7–4.0)
MCH: 29.2 pg (ref 26.0–34.0)
MCHC: 32.9 g/dL (ref 30.0–36.0)
MCV: 88.6 fL (ref 80.0–100.0)
Monocytes Absolute: 0.5 10*3/uL (ref 0.1–1.0)
Monocytes Relative: 6 %
Neutro Abs: 5.2 10*3/uL (ref 1.7–7.7)
Neutrophils Relative %: 65 %
Platelets: 379 10*3/uL (ref 150–400)
RBC: 4.49 MIL/uL (ref 3.87–5.11)
RDW: 13.9 % (ref 11.5–15.5)
WBC: 8 10*3/uL (ref 4.0–10.5)
nRBC: 0 % (ref 0.0–0.2)

## 2021-12-31 LAB — SARS CORONAVIRUS 2 BY RT PCR (HOSPITAL ORDER, PERFORMED IN ~~LOC~~ HOSPITAL LAB): SARS Coronavirus 2: NEGATIVE

## 2021-12-31 LAB — PROTIME-INR
INR: 0.9 (ref 0.8–1.2)
Prothrombin Time: 12.6 seconds (ref 11.4–15.2)

## 2021-12-31 LAB — HCG, SERUM, QUALITATIVE: Preg, Serum: NEGATIVE

## 2021-12-31 MED ORDER — PROMETHAZINE HCL 25 MG PO TABS
25.0000 mg | ORAL_TABLET | Freq: Three times a day (TID) | ORAL | Status: DC | PRN
  Administered 2021-12-31: 25 mg via ORAL
  Filled 2021-12-31: qty 1

## 2021-12-31 MED ORDER — MIDAZOLAM HCL 2 MG/2ML IJ SOLN
INTRAMUSCULAR | Status: AC | PRN
Start: 2021-12-31 — End: 2021-12-31
  Administered 2021-12-31: 1 mg via INTRAVENOUS

## 2021-12-31 MED ORDER — NITROGLYCERIN IN D5W 100-5 MCG/ML-% IV SOLN
INTRAVENOUS | Status: AC
  Filled 2021-12-31: qty 250

## 2021-12-31 MED ORDER — NITROGLYCERIN 1 MG/10 ML FOR IR/CATH LAB
INTRA_ARTERIAL | Status: AC | PRN
  Administered 2021-12-31: 200 ug via INTRA_ARTERIAL

## 2021-12-31 MED ORDER — SODIUM CHLORIDE 0.9 % IV SOLN: INTRAVENOUS | Status: DC

## 2021-12-31 MED ORDER — FENTANYL CITRATE (PF) 100 MCG/2ML IJ SOLN
INTRAMUSCULAR | Status: AC | PRN
  Administered 2021-12-31: 50 ug via INTRAVENOUS

## 2021-12-31 MED ORDER — KETOROLAC TROMETHAMINE 30 MG/ML IJ SOLN
30.0000 mg | Freq: Four times a day (QID) | INTRAMUSCULAR | Status: DC
  Administered 2021-12-31 – 2022-01-01 (×3): 30 mg via INTRAVENOUS
  Filled 2021-12-31 (×3): qty 1

## 2021-12-31 MED ORDER — SUMATRIPTAN SUCCINATE 25 MG PO TABS
25.0000 mg | ORAL_TABLET | Freq: Two times a day (BID) | ORAL | Status: DC | PRN
  Filled 2021-12-31: qty 1

## 2021-12-31 MED ORDER — ONDANSETRON HCL 4 MG/2ML IJ SOLN: 4.0000 mg | Freq: Four times a day (QID) | INTRAMUSCULAR | Status: DC | PRN

## 2021-12-31 MED ORDER — SODIUM CHLORIDE 0.9% FLUSH: 3.0000 mL | INTRAVENOUS | Status: DC | PRN

## 2021-12-31 MED ORDER — QUETIAPINE FUMARATE ER 300 MG PO TB24
300.0000 mg | ORAL_TABLET | Freq: Every day | ORAL | Status: DC
  Administered 2021-12-31: 300 mg via ORAL
  Filled 2021-12-31: qty 1

## 2021-12-31 MED ORDER — DOCUSATE SODIUM 100 MG PO CAPS
100.0000 mg | ORAL_CAPSULE | Freq: Two times a day (BID) | ORAL | Status: DC
  Administered 2021-12-31 – 2022-01-01 (×2): 100 mg via ORAL
  Filled 2021-12-31 (×2): qty 1

## 2021-12-31 MED ORDER — LIDOCAINE HCL (PF) 1 % IJ SOLN
INTRAMUSCULAR | Status: AC | PRN
  Administered 2021-12-31: 5 mL via INTRADERMAL

## 2021-12-31 MED ORDER — IOHEXOL 300 MG/ML  SOLN
100.0000 mL | Freq: Once | INTRAMUSCULAR | Status: AC | PRN
  Administered 2021-12-31: 24 mL via INTRA_ARTERIAL

## 2021-12-31 MED ORDER — MIDAZOLAM HCL 2 MG/2ML IJ SOLN
INTRAMUSCULAR | Status: AC | PRN
  Administered 2021-12-31: 1 mg via INTRAVENOUS

## 2021-12-31 MED ORDER — CEFAZOLIN SODIUM-DEXTROSE 2-4 GM/100ML-% IV SOLN: 2.0000 g | INTRAVENOUS | Status: AC

## 2021-12-31 MED ORDER — LIDOCAINE HCL 1 % IJ SOLN
INTRAMUSCULAR | Status: AC
  Filled 2021-12-31: qty 20

## 2021-12-31 MED ORDER — ADULT MULTIVITAMIN W/MINERALS CH
1.0000 | ORAL_TABLET | Freq: Every day | ORAL | Status: DC
  Administered 2021-12-31 – 2022-01-01 (×2): 1 via ORAL
  Filled 2021-12-31 (×2): qty 1

## 2021-12-31 MED ORDER — FENTANYL 50 MCG/ML IV PCA SOLN
INTRAVENOUS | Status: DC
  Administered 2022-01-01: 60 ug via INTRAVENOUS
  Administered 2022-01-01: 150 ug via INTRAVENOUS
  Filled 2021-12-31: qty 20

## 2021-12-31 MED ORDER — NALOXONE HCL 0.4 MG/ML IJ SOLN: 0.4000 mg | INTRAMUSCULAR | Status: DC | PRN

## 2021-12-31 MED ORDER — LAMOTRIGINE 25 MG PO TABS
150.0000 mg | ORAL_TABLET | Freq: Every day | ORAL | Status: DC
  Administered 2021-12-31: 150 mg via ORAL
  Filled 2021-12-31 (×2): qty 2

## 2021-12-31 MED ORDER — SODIUM CHLORIDE 0.9% FLUSH: 9.0000 mL | INTRAVENOUS | Status: DC | PRN

## 2021-12-31 MED ORDER — FLUOXETINE HCL 20 MG PO CAPS
20.0000 mg | ORAL_CAPSULE | Freq: Every morning | ORAL | Status: DC
  Administered 2022-01-01: 20 mg via ORAL
  Filled 2021-12-31: qty 1

## 2021-12-31 MED ORDER — CEFAZOLIN SODIUM-DEXTROSE 2-4 GM/100ML-% IV SOLN
INTRAVENOUS | Status: AC
  Administered 2021-12-31: 2 g via INTRAVENOUS
  Filled 2021-12-31: qty 100

## 2021-12-31 MED ORDER — HYDROCORTISONE 0.5 % EX CREA
1.0000 "application " | TOPICAL_CREAM | CUTANEOUS | Status: DC | PRN
  Filled 2021-12-31: qty 28.35

## 2021-12-31 MED ORDER — FENTANYL CITRATE (PF) 100 MCG/2ML IJ SOLN
INTRAMUSCULAR | Status: AC
  Filled 2021-12-31: qty 4

## 2021-12-31 MED ORDER — IOHEXOL 300 MG/ML  SOLN
100.0000 mL | Freq: Once | INTRAMUSCULAR | Status: AC | PRN
  Administered 2021-12-31: 100 mL via INTRA_ARTERIAL

## 2021-12-31 MED ORDER — DIPHENHYDRAMINE HCL 12.5 MG/5ML PO ELIX
12.5000 mg | ORAL_SOLUTION | Freq: Four times a day (QID) | ORAL | Status: DC | PRN
  Filled 2021-12-31: qty 5

## 2021-12-31 MED ORDER — AMPHETAMINE-DEXTROAMPHETAMINE 10 MG PO TABS: 20.0000 mg | ORAL_TABLET | Freq: Every morning | ORAL | Status: DC

## 2021-12-31 MED ORDER — ONDANSETRON HCL 4 MG/2ML IJ SOLN
4.0000 mg | Freq: Four times a day (QID) | INTRAMUSCULAR | Status: DC | PRN
  Administered 2021-12-31: 4 mg via INTRAVENOUS
  Filled 2021-12-31: qty 2

## 2021-12-31 MED ORDER — BUPROPION HCL ER (XL) 300 MG PO TB24
300.0000 mg | ORAL_TABLET | Freq: Every morning | ORAL | Status: DC
  Administered 2022-01-01: 300 mg via ORAL
  Filled 2021-12-31: qty 1

## 2021-12-31 MED ORDER — DIPHENHYDRAMINE HCL 50 MG/ML IJ SOLN: 12.5000 mg | Freq: Four times a day (QID) | INTRAMUSCULAR | Status: DC | PRN

## 2021-12-31 MED ORDER — DEXAMETHASONE 4 MG PO TABS
8.0000 mg | ORAL_TABLET | ORAL | Status: AC
  Administered 2021-12-31: 8 mg via ORAL
  Filled 2021-12-31: qty 2

## 2021-12-31 MED ORDER — SODIUM CHLORIDE 0.9% FLUSH: 3.0000 mL | Freq: Two times a day (BID) | INTRAVENOUS | Status: DC

## 2021-12-31 MED ORDER — PROMETHAZINE HCL 25 MG RE SUPP
25.0000 mg | Freq: Three times a day (TID) | RECTAL | Status: DC | PRN
  Filled 2021-12-31: qty 1

## 2021-12-31 MED ORDER — MIDAZOLAM HCL 2 MG/2ML IJ SOLN
INTRAMUSCULAR | Status: AC
  Filled 2021-12-31: qty 6

## 2021-12-31 MED ORDER — SODIUM CHLORIDE 0.9 % IV SOLN: 250.0000 mL | INTRAVENOUS | Status: DC | PRN

## 2021-12-31 NOTE — H&P (Signed)
Referring Physician(s): Cousins,S  Supervising Physician: Daryll Brod  Patient Status:  [facility] TBA  Chief Complaint: Symptomatic uterine fibroids   Subjective: Patient familiar to IR service from consultation with Dr.Shick on 10/14/2021 to discuss treatment options for symptomatic uterine fibroids.  She was deemed an appropriate candidate for bilateral uterine artery embolization and presents today for the procedure.  She currently denies fever, chest pain, dyspnea, back pain, nausea, vomiting.  She does have occasional migraines, occasional cough, minimal pelvic cramping and menorrhagia.  She is also very anxious.  Past Medical History:  Diagnosis Date   Abnormal uterine bleeding (AUB)    ADHD    Bipolar disorder (HCC)    GERD (gastroesophageal reflux disease)    Headache    migraines   Heat rash    on hips x 1 week using hydrocortisone cream and ice pack prn   OCD (obsessive compulsive disorder)    Past Surgical History:  Procedure Laterality Date   DILATATION & CURETTAGE/HYSTEROSCOPY WITH MYOSURE N/A 03/28/2020   Procedure: DILATATION & CURETTAGE/HYSTEROSCOPY WITH DISSECTION OF SUB MUCOSAL FIBROID,WITH MYOSURE XL, AND ENDOCERVICAL AND ENDOMETRIAL POLYPECTOMY WITH MYOSURE XL;  Surgeon: Servando Salina, MD;  Location: [place];  Service: Gynecology;  Laterality: N/A;   HEMORRHOID SURGERY     IR RADIOLOGIST EVAL & MGMT  10/14/2021   LAPAROSCOPIC APPENDECTOMY N/A 12/23/2015   Procedure: APPENDECTOMY LAPAROSCOPIC;  Surgeon: Alphonsa Overall, MD;  Location: [facility];  Service: General;  Laterality: N/A;        Past Medical History:  Diagnosis Date   Abnormal uterine bleeding (AUB)    ADHD    Bipolar disorder (HCC)    GERD (gastroesophageal reflux disease)    Headache    migraines   Heat rash    on hips x 1 week using hydrocortisone cream and ice pack prn   OCD (obsessive compulsive disorder)    Past Surgical History:  Procedure Laterality Date    DILATATION & CURETTAGE/HYSTEROSCOPY WITH MYOSURE N/A 03/28/2020   Procedure: DILATATION & CURETTAGE/HYSTEROSCOPY WITH DISSECTION OF SUB MUCOSAL FIBROID,WITH MYOSURE XL, AND ENDOCERVICAL AND ENDOMETRIAL POLYPECTOMY WITH MYOSURE XL;  Surgeon: Servando Salina, MD;  Location: [place];  Service: Gynecology;  Laterality: N/A;   HEMORRHOID SURGERY     IR RADIOLOGIST EVAL & MGMT  10/14/2021   LAPAROSCOPIC APPENDECTOMY N/A 12/23/2015   Procedure: APPENDECTOMY LAPAROSCOPIC;  Surgeon: Alphonsa Overall, MD;  Location: [facility];  Service: General;  Laterality: N/A;      Allergies: Other  Medications: Prior to Admission medications   Medication Sig Start Date End Date Taking? Authorizing Provider  ALPRAZolam Duanne Moron) 1 MG tablet Take 1 mg by mouth at bedtime.  12/29/19   [provider]  amphetamine-dextroamphetamine (ADDERALL) 20 MG tablet Take 20 mg by mouth every morning.  12/11/15   [provider]  buPROPion (WELLBUTRIN XL) 300 MG 24 hr tablet Take 300 mg by mouth every morning.  09/17/15   [provider]  FLUoxetine (PROZAC) 20 MG capsule Take 20 mg by mouth every morning.  09/17/15   [provider]  hydrocortisone cream 0.5 % Apply 1 application topically as needed for itching. To heat rash on hips    [provider]  lamoTRIgine (LAMICTAL) 150 MG tablet Take 150 mg by mouth at bedtime. 11/17/19   [provider]  Multiple Vitamin (MULTIVITAMIN WITH MINERALS) TABS tablet Take 1 tablet by mouth daily.    [provider]  Norgestimate-Eth Estradiol ([place] 28  PO) Take by mouth every evening.    [provider]  QUEtiapine (SEROQUEL XR) 300 MG 24 hr tablet Take 300 mg by mouth at bedtime.  11/13/15   [provider]  SUMAtriptan (IMITREX) 25 MG tablet Take 25 mg by mouth See admin instructions. Take 25 mg at onset of migraine.  May repeat in 2 hours if needed. 12/19/15   [provider]  Wheat  Dextrin (BENEFIBER) POWD Take 2 Scoops by mouth daily. Mixed in with coffee    [provider]     Vital Signs: BP (!) 142/88    Pulse (!) 122    Temp 98.1 F (36.7 C) (Oral)    Resp 17    SpO2 97%   Physical Exam awake, alert.  Chest clear to auscultation bilaterally.  Heart with regular rate and rhythm.  Abdomen soft, positive bowel sounds, mildly tender pelvic region to palpation; no lower extremity edema.  Imaging: No results found.  Labs:  CBC: No results for input(s): WBC, HGB, HCT, PLT in the last 8760 hours.  COAGS: No results for input(s): INR, APTT in the last 8760 hours.  BMP: No results for input(s): NA, K, CL, CO2, GLUCOSE, BUN, CALCIUM, CREATININE, GFRNONAA, GFRAA in the last 8760 hours.  Invalid input(s): CMP  LIVER FUNCTION TESTS: No results for input(s): BILITOT, AST, ALT, ALKPHOS, PROT, ALBUMIN in the last 8760 hours.  Assessment and Plan: Patient familiar to IR service from consultation with Dr.Shick on 10/14/2021 to discuss treatment options for symptomatic uterine fibroids.  Past medical history significant for ADHD, bipolar disorder, GERD, migraine headaches, OCD. She was deemed an appropriate candidate for bilateral uterine artery embolization and presents today for the procedure. Risks and benefits of procedure were discussed with the patient including, but not limited to bleeding, infection, vascular injury or contrast induced renal failure.  This interventional procedure involves the use of X-rays and because of the nature of the planned procedure, it is possible that we will have prolonged use of X-ray fluoroscopy.  Potential radiation risks to you include (but are not limited to) the following: - A slightly elevated risk for cancer  several years later in life. This risk is typically less than 0.5% percent. This risk is low in comparison to the normal incidence of human cancer, which is 33% for women and 50% for men according to the [employer]. - Radiation induced injury can include skin redness, resembling a rash, tissue breakdown / ulcers and hair loss (which can be temporary or permanent).   The likelihood of either of these occurring depends on the difficulty of the procedure and whether you are sensitive to radiation due to previous procedures, disease, or genetic conditions.   IF your procedure requires a prolonged use of radiation, you will be notified and given written instructions for further action.  It is your responsibility to monitor the irradiated area for the 2 weeks following the procedure and to notify your physician if you are concerned that you have suffered a radiation induced injury.    All of the patient's questions were answered, patient is agreeable to proceed.  Consent signed and in chart.  Post procedure patient will be admitted for overnight observation for pain control    Electronically Signed: D. Rowe Robert, PA-C 12/31/2021, 11:44 AM   I spent a total of 30 minutes at the the patient's bedside AND on the patient's hospital floor or unit, greater than 50% of which was counseling/coordinating care for bilateral  uterine artery embolization

## 2021-12-31 NOTE — Procedures (Signed)
Interventional Radiology Procedure Note  Procedure: UFE    Complications: None  Estimated Blood Loss:  MIN  Findings: FULL REPORT IN PACS     M. TREVOR Lekendrick Alpern, MD    

## 2021-12-31 NOTE — Progress Notes (Signed)
PCA tubing noted leaking at leur-lock site, unable to fix w various methods, PCA tubing changed and new tubing flushed w Fentanyl. Old PCA tubing w Fentanyl wasted in [place] and witnessed by Aldona Bar, RN

## 2022-01-01 ENCOUNTER — Telehealth: Payer: Self-pay | Admitting: Student

## 2022-01-01 DIAGNOSIS — D259 Leiomyoma of uterus, unspecified: Secondary | ICD-10-CM | POA: Diagnosis not present

## 2022-01-01 MED ORDER — ONDANSETRON HCL 4 MG PO TABS
4.0000 mg | ORAL_TABLET | Freq: Three times a day (TID) | ORAL | 0 refills | Status: AC | PRN
Start: 2022-01-01 — End: 2022-01-08

## 2022-01-01 MED ORDER — HYDROCODONE-ACETAMINOPHEN 5-325 MG PO TABS
1.0000 | ORAL_TABLET | Freq: Four times a day (QID) | ORAL | Status: DC | PRN
  Administered 2022-01-01: 1 via ORAL
  Filled 2022-01-01: qty 1

## 2022-01-01 MED ORDER — HYDROCODONE-ACETAMINOPHEN 5-325 MG PO TABS: 1.0000 | ORAL_TABLET | Freq: Four times a day (QID) | ORAL | 0 refills | Status: AC | PRN

## 2022-01-01 MED ORDER — TRAMADOL HCL 50 MG PO TABS: 50.0000 mg | ORAL_TABLET | Freq: Four times a day (QID) | ORAL | 0 refills | Status: AC | PRN

## 2022-01-01 MED ORDER — CHLORHEXIDINE GLUCONATE CLOTH 2 % EX PADS: 6.0000 | MEDICATED_PAD | Freq: Every day | CUTANEOUS | Status: DC

## 2022-01-01 MED ORDER — ONDANSETRON HCL 4 MG PO TABS: 4.0000 mg | ORAL_TABLET | Freq: Three times a day (TID) | ORAL | Status: DC | PRN

## 2022-01-01 NOTE — Progress Notes (Signed)
Pt has DC order. Foley has been removed this AM and pt voided with no problem. Pain control is better. AVS was given and discussed to pt.

## 2022-01-01 NOTE — Progress Notes (Signed)
Transition of Care [facility]) Screening Note  Patient Details  Name: Lydia [name] Date of Birth: February 22, 1976  Transition of Care [facility]) CM/SW Contact:    Sherie Don, LCSW Phone Number: 01/01/2022, 11:02 AM  Transition of Care Department [facility]) has reviewed patient and no TOC needs have been identified at this time. We will continue to monitor patient advancement through interdisciplinary progression rounds. If new patient transition needs arise, please place a TOC consult.

## 2022-01-01 NOTE — Discharge Instructions (Addendum)
Please take acetaminophen or Norco for pain relief. Do not exceed 4000 mg of acetaminophen in a 24 hour period. A daily stool softener is recommended while taking narcotic pain medication.

## 2022-01-01 NOTE — Telephone Encounter (Signed)
Patient's husband notified IR that Norco/Vicodin is on back order and he was unable to pick up the prescription for Mrs. Lydia [name]. I called the [facility] and spoke with a pharmacist who confirmed this information. I cancelled the order for Norco and e-prescribed Tramadol 50 mg Q6 hours for 5 days with zero refills.  Soyla Dryer, [place] [phone] 01/01/2022, 2:47 PM

## 2022-01-01 NOTE — Discharge Summary (Signed)
Patient ID: Lydia [name] MRN: [id number] DOB/AGE: Apr 16, 1976 46 y.o.  Admit date: 12/31/2021 Discharge date: 01/01/2022  Supervising Physician: Daryll Brod  Patient Status: [facility] - In-pt  Admission Diagnoses: Symptomatic uterine fibroids s/p bilateral uterine artery embolization 12/31/21 with Dr. Annamaria Boots.   Discharge Diagnoses:  Principal Problem:   Fibroids, submucosal   Discharged Condition: good  Hospital Course: Patient presented to [facility] IR 12/31/21 for an elective image-guided bilateral uterine artery embolization for symptomatic uterine fibroids. She was admitted for overnight observation and had an uneventful night. Her foley catheter was removed this morning. She has eaten, ambulated and voided. She rates her abdominal pain/cramps at a 3/10. Bilateral femoral artery sites are clean, soft and dry. She has moderate tenderness to palpation at these sites. The dressings were removed and the puncture sites are unremarkable.   She has been prescribed pain and nausea medicine. She was encouraged to take a daily stool softener while she is taking narcotic pain medication. She will follow up with Dr. Annamaria Boots in approximately one month. She knows she can call the clinic with any questions prior to her follow up appointment.   Consults: None  Significant Diagnostic Studies: IR Angiogram Pelvis Selective Or Supraselective  Result Date: 12/31/2021 INDICATION: Abnormal uterine bleeding requiring hormone therapy, symptomatic submucosal small uterine fibroids EXAM: UTERINE FIBROID EMBOLIZATION Date:  12/31/2021 12/31/2021 3:44 pm Radiologist:  Jerilynn Mages. Daryll Brod, MD Guidance:  Ultrasound and fluoroscopic MEDICATIONS: Ancef 2 g. The antibiotic was administered within 1 hour of the procedure ANESTHESIA/SEDATION: Fentanyl 200 mcg IV; Versed 6.0 mg IV Moderate Sedation Time:  1 hour 42 minutes The patient was continuously monitored during the procedure by the interventional radiology nurse under my direct supervision.  CONTRAST:  152mL OMNIPAQUE IOHEXOL 300 MG/ML SOLN, 71mL OMNIPAQUE IOHEXOL 300 MG/ML SOLN FLUOROSCOPY TIME:  Fluoroscopy Time: 21 minutes 54 seconds (1,928 mGy). COMPLICATIONS: None immediate. PROCEDURE: Informed consent was obtained from the patient following explanation of the procedure, risks, benefits and alternatives. The patient understands, agrees and consents for the procedure. All questions were addressed. A time out was performed. Maximal barrier sterile technique utilized including caps, mask, sterile gowns, sterile gloves, large sterile drape, hand hygiene, and betadine prep. Under sterile conditions and local anesthesia, ultrasound micropuncture access performed of the common femoral arteries bilaterally. Images obtained for documentation of the patent common femoral arteries. Five French sheath inserted bilaterally over Bentson guidewires. C2 catheter utilized to select the contralateral internal iliac arteries. Internal iliac angiograms performed bilaterally. Iliac angiograms: Iliac vasculature all patent bilaterally. Small uterine arteries are demonstrated off of the anterior divisions bilaterally. Utilizing cone beam CT and 3D imaging, the uterine vasculature was localized and mapped bilaterally. Renegade high flow micro catheters were advanced into the uterine arteries bilaterally over double angled glide wires. Bilateral uterine angiograms performed. This confirms catheter position within the uterine vasculature bilaterally. Small amount of spasm noted in the small uterine arteries proximally. Prior to embolization, 200 mcg nitroglycerin instilled intra-arterially into both uterine arteries. For left uterine artery embolization, 1 vial of of 500 - 772micron Embospheres were injected into the left uterine artery. For right uterine artery embolization, 2.75 vials of 500-700 micron embospheres were injected. Post embolization angiograms confirmed stasis within both uterine artery vascular territories.  C2 catheters and micro catheters removed bilaterally. Injection of the common femoral arteries bilaterally confirms position within the common femoral vasculature and safe for closure device. Hemostasis obtained with the Celt device bilaterally. The patient tolerated the procedure well. No immediate complication. IMPRESSION:  Successful bilateral uterine artery embolization (U F E) Electronically Signed   By: Jerilynn Mages.  Shick M.D.   On: 12/31/2021 16:02   IR Angiogram Selective Each Additional Vessel  Result Date: 12/31/2021 INDICATION: Abnormal uterine bleeding requiring hormone therapy, symptomatic submucosal small uterine fibroids EXAM: UTERINE FIBROID EMBOLIZATION Date:  12/31/2021 12/31/2021 3:44 pm Radiologist:  Jerilynn Mages. Daryll Brod, MD Guidance:  Ultrasound and fluoroscopic MEDICATIONS: Ancef 2 g. The antibiotic was administered within 1 hour of the procedure ANESTHESIA/SEDATION: Fentanyl 200 mcg IV; Versed 6.0 mg IV Moderate Sedation Time:  1 hour 42 minutes The patient was continuously monitored during the procedure by the interventional radiology nurse under my direct supervision. CONTRAST:  167mL OMNIPAQUE IOHEXOL 300 MG/ML SOLN, 26mL OMNIPAQUE IOHEXOL 300 MG/ML SOLN FLUOROSCOPY TIME:  Fluoroscopy Time: 21 minutes 54 seconds (1,928 mGy). COMPLICATIONS: None immediate. PROCEDURE: Informed consent was obtained from the patient following explanation of the procedure, risks, benefits and alternatives. The patient understands, agrees and consents for the procedure. All questions were addressed. A time out was performed. Maximal barrier sterile technique utilized including caps, mask, sterile gowns, sterile gloves, large sterile drape, hand hygiene, and betadine prep. Under sterile conditions and local anesthesia, ultrasound micropuncture access performed of the common femoral arteries bilaterally. Images obtained for documentation of the patent common femoral arteries. Five French sheath inserted bilaterally over Bentson  guidewires. C2 catheter utilized to select the contralateral internal iliac arteries. Internal iliac angiograms performed bilaterally. Iliac angiograms: Iliac vasculature all patent bilaterally. Small uterine arteries are demonstrated off of the anterior divisions bilaterally. Utilizing cone beam CT and 3D imaging, the uterine vasculature was localized and mapped bilaterally. Renegade high flow micro catheters were advanced into the uterine arteries bilaterally over double angled glide wires. Bilateral uterine angiograms performed. This confirms catheter position within the uterine vasculature bilaterally. Small amount of spasm noted in the small uterine arteries proximally. Prior to embolization, 200 mcg nitroglycerin instilled intra-arterially into both uterine arteries. For left uterine artery embolization, 1 vial of of 500 - 745micron Embospheres were injected into the left uterine artery. For right uterine artery embolization, 2.75 vials of 500-700 micron embospheres were injected. Post embolization angiograms confirmed stasis within both uterine artery vascular territories. C2 catheters and micro catheters removed bilaterally. Injection of the common femoral arteries bilaterally confirms position within the common femoral vasculature and safe for closure device. Hemostasis obtained with the Celt device bilaterally. The patient tolerated the procedure well. No immediate complication. IMPRESSION: Successful bilateral uterine artery embolization (U F E) Electronically Signed   By: Jerilynn Mages.  Shick M.D.   On: 12/31/2021 16:02   IR Angiogram Selective Each Additional Vessel  Result Date: 12/31/2021 INDICATION: Abnormal uterine bleeding requiring hormone therapy, symptomatic submucosal small uterine fibroids EXAM: UTERINE FIBROID EMBOLIZATION Date:  12/31/2021 12/31/2021 3:44 pm Radiologist:  Jerilynn Mages. Daryll Brod, MD Guidance:  Ultrasound and fluoroscopic MEDICATIONS: Ancef 2 g. The antibiotic was administered within 1 hour of  the procedure ANESTHESIA/SEDATION: Fentanyl 200 mcg IV; Versed 6.0 mg IV Moderate Sedation Time:  1 hour 42 minutes The patient was continuously monitored during the procedure by the interventional radiology nurse under my direct supervision. CONTRAST:  117mL OMNIPAQUE IOHEXOL 300 MG/ML SOLN, 74mL OMNIPAQUE IOHEXOL 300 MG/ML SOLN FLUOROSCOPY TIME:  Fluoroscopy Time: 21 minutes 54 seconds (1,928 mGy). COMPLICATIONS: None immediate. PROCEDURE: Informed consent was obtained from the patient following explanation of the procedure, risks, benefits and alternatives. The patient understands, agrees and consents for the procedure. All questions were addressed. A time out was performed.  Maximal barrier sterile technique utilized including caps, mask, sterile gowns, sterile gloves, large sterile drape, hand hygiene, and betadine prep. Under sterile conditions and local anesthesia, ultrasound micropuncture access performed of the common femoral arteries bilaterally. Images obtained for documentation of the patent common femoral arteries. Five French sheath inserted bilaterally over Bentson guidewires. C2 catheter utilized to select the contralateral internal iliac arteries. Internal iliac angiograms performed bilaterally. Iliac angiograms: Iliac vasculature all patent bilaterally. Small uterine arteries are demonstrated off of the anterior divisions bilaterally. Utilizing cone beam CT and 3D imaging, the uterine vasculature was localized and mapped bilaterally. Renegade high flow micro catheters were advanced into the uterine arteries bilaterally over double angled glide wires. Bilateral uterine angiograms performed. This confirms catheter position within the uterine vasculature bilaterally. Small amount of spasm noted in the small uterine arteries proximally. Prior to embolization, 200 mcg nitroglycerin instilled intra-arterially into both uterine arteries. For left uterine artery embolization, 1 vial of of 500 - 724micron  Embospheres were injected into the left uterine artery. For right uterine artery embolization, 2.75 vials of 500-700 micron embospheres were injected. Post embolization angiograms confirmed stasis within both uterine artery vascular territories. C2 catheters and micro catheters removed bilaterally. Injection of the common femoral arteries bilaterally confirms position within the common femoral vasculature and safe for closure device. Hemostasis obtained with the Celt device bilaterally. The patient tolerated the procedure well. No immediate complication. IMPRESSION: Successful bilateral uterine artery embolization (U F E) Electronically Signed   By: Jerilynn Mages.  Shick M.D.   On: 12/31/2021 16:02   IR US Guide Vasc Access Left  Result Date: 12/31/2021 INDICATION: Abnormal uterine bleeding requiring hormone therapy, symptomatic submucosal small uterine fibroids EXAM: UTERINE FIBROID EMBOLIZATION Date:  12/31/2021 12/31/2021 3:44 pm Radiologist:  Jerilynn Mages. Daryll Brod, MD Guidance:  Ultrasound and fluoroscopic MEDICATIONS: Ancef 2 g. The antibiotic was administered within 1 hour of the procedure ANESTHESIA/SEDATION: Fentanyl 200 mcg IV; Versed 6.0 mg IV Moderate Sedation Time:  1 hour 42 minutes The patient was continuously monitored during the procedure by the interventional radiology nurse under my direct supervision. CONTRAST:  191mL OMNIPAQUE IOHEXOL 300 MG/ML SOLN, 34mL OMNIPAQUE IOHEXOL 300 MG/ML SOLN FLUOROSCOPY TIME:  Fluoroscopy Time: 21 minutes 54 seconds (1,928 mGy). COMPLICATIONS: None immediate. PROCEDURE: Informed consent was obtained from the patient following explanation of the procedure, risks, benefits and alternatives. The patient understands, agrees and consents for the procedure. All questions were addressed. A time out was performed. Maximal barrier sterile technique utilized including caps, mask, sterile gowns, sterile gloves, large sterile drape, hand hygiene, and betadine prep. Under sterile conditions and  local anesthesia, ultrasound micropuncture access performed of the common femoral arteries bilaterally. Images obtained for documentation of the patent common femoral arteries. Five French sheath inserted bilaterally over Bentson guidewires. C2 catheter utilized to select the contralateral internal iliac arteries. Internal iliac angiograms performed bilaterally. Iliac angiograms: Iliac vasculature all patent bilaterally. Small uterine arteries are demonstrated off of the anterior divisions bilaterally. Utilizing cone beam CT and 3D imaging, the uterine vasculature was localized and mapped bilaterally. Renegade high flow micro catheters were advanced into the uterine arteries bilaterally over double angled glide wires. Bilateral uterine angiograms performed. This confirms catheter position within the uterine vasculature bilaterally. Small amount of spasm noted in the small uterine arteries proximally. Prior to embolization, 200 mcg nitroglycerin instilled intra-arterially into both uterine arteries. For left uterine artery embolization, 1 vial of of 500 - 767micron Embospheres were injected into the left uterine artery. For right uterine  artery embolization, 2.75 vials of 500-700 micron embospheres were injected. Post embolization angiograms confirmed stasis within both uterine artery vascular territories. C2 catheters and micro catheters removed bilaterally. Injection of the common femoral arteries bilaterally confirms position within the common femoral vasculature and safe for closure device. Hemostasis obtained with the Celt device bilaterally. The patient tolerated the procedure well. No immediate complication. IMPRESSION: Successful bilateral uterine artery embolization (U F E) Electronically Signed   By: Jerilynn Mages.  Shick M.D.   On: 12/31/2021 16:02   IR US Guide Vasc Access Right  Result Date: 12/31/2021 INDICATION: Abnormal uterine bleeding requiring hormone therapy, symptomatic submucosal small uterine fibroids  EXAM: UTERINE FIBROID EMBOLIZATION Date:  12/31/2021 12/31/2021 3:44 pm Radiologist:  Jerilynn Mages. Daryll Brod, MD Guidance:  Ultrasound and fluoroscopic MEDICATIONS: Ancef 2 g. The antibiotic was administered within 1 hour of the procedure ANESTHESIA/SEDATION: Fentanyl 200 mcg IV; Versed 6.0 mg IV Moderate Sedation Time:  1 hour 42 minutes The patient was continuously monitored during the procedure by the interventional radiology nurse under my direct supervision. CONTRAST:  129mL OMNIPAQUE IOHEXOL 300 MG/ML SOLN, 26mL OMNIPAQUE IOHEXOL 300 MG/ML SOLN FLUOROSCOPY TIME:  Fluoroscopy Time: 21 minutes 54 seconds (1,928 mGy). COMPLICATIONS: None immediate. PROCEDURE: Informed consent was obtained from the patient following explanation of the procedure, risks, benefits and alternatives. The patient understands, agrees and consents for the procedure. All questions were addressed. A time out was performed. Maximal barrier sterile technique utilized including caps, mask, sterile gowns, sterile gloves, large sterile drape, hand hygiene, and betadine prep. Under sterile conditions and local anesthesia, ultrasound micropuncture access performed of the common femoral arteries bilaterally. Images obtained for documentation of the patent common femoral arteries. Five French sheath inserted bilaterally over Bentson guidewires. C2 catheter utilized to select the contralateral internal iliac arteries. Internal iliac angiograms performed bilaterally. Iliac angiograms: Iliac vasculature all patent bilaterally. Small uterine arteries are demonstrated off of the anterior divisions bilaterally. Utilizing cone beam CT and 3D imaging, the uterine vasculature was localized and mapped bilaterally. Renegade high flow micro catheters were advanced into the uterine arteries bilaterally over double angled glide wires. Bilateral uterine angiograms performed. This confirms catheter position within the uterine vasculature bilaterally. Small amount of spasm  noted in the small uterine arteries proximally. Prior to embolization, 200 mcg nitroglycerin instilled intra-arterially into both uterine arteries. For left uterine artery embolization, 1 vial of of 500 - 722micron Embospheres were injected into the left uterine artery. For right uterine artery embolization, 2.75 vials of 500-700 micron embospheres were injected. Post embolization angiograms confirmed stasis within both uterine artery vascular territories. C2 catheters and micro catheters removed bilaterally. Injection of the common femoral arteries bilaterally confirms position within the common femoral vasculature and safe for closure device. Hemostasis obtained with the Celt device bilaterally. The patient tolerated the procedure well. No immediate complication. IMPRESSION: Successful bilateral uterine artery embolization (U F E) Electronically Signed   By: Jerilynn Mages.  Shick M.D.   On: 12/31/2021 16:02   IR EMBO TUMOR ORGAN ISCHEMIA INFARCT INC GUIDE ROADMAPPING  Result Date: 12/31/2021 INDICATION: Abnormal uterine bleeding requiring hormone therapy, symptomatic submucosal small uterine fibroids EXAM: UTERINE FIBROID EMBOLIZATION Date:  12/31/2021 12/31/2021 3:44 pm Radiologist:  Jerilynn Mages. Daryll Brod, MD Guidance:  Ultrasound and fluoroscopic MEDICATIONS: Ancef 2 g. The antibiotic was administered within 1 hour of the procedure ANESTHESIA/SEDATION: Fentanyl 200 mcg IV; Versed 6.0 mg IV Moderate Sedation Time:  1 hour 42 minutes The patient was continuously monitored during the procedure by the interventional radiology nurse  under my direct supervision. CONTRAST:  149mL OMNIPAQUE IOHEXOL 300 MG/ML SOLN, 73mL OMNIPAQUE IOHEXOL 300 MG/ML SOLN FLUOROSCOPY TIME:  Fluoroscopy Time: 21 minutes 54 seconds (1,928 mGy). COMPLICATIONS: None immediate. PROCEDURE: Informed consent was obtained from the patient following explanation of the procedure, risks, benefits and alternatives. The patient understands, agrees and consents for the  procedure. All questions were addressed. A time out was performed. Maximal barrier sterile technique utilized including caps, mask, sterile gowns, sterile gloves, large sterile drape, hand hygiene, and betadine prep. Under sterile conditions and local anesthesia, ultrasound micropuncture access performed of the common femoral arteries bilaterally. Images obtained for documentation of the patent common femoral arteries. Five French sheath inserted bilaterally over Bentson guidewires. C2 catheter utilized to select the contralateral internal iliac arteries. Internal iliac angiograms performed bilaterally. Iliac angiograms: Iliac vasculature all patent bilaterally. Small uterine arteries are demonstrated off of the anterior divisions bilaterally. Utilizing cone beam CT and 3D imaging, the uterine vasculature was localized and mapped bilaterally. Renegade high flow micro catheters were advanced into the uterine arteries bilaterally over double angled glide wires. Bilateral uterine angiograms performed. This confirms catheter position within the uterine vasculature bilaterally. Small amount of spasm noted in the small uterine arteries proximally. Prior to embolization, 200 mcg nitroglycerin instilled intra-arterially into both uterine arteries. For left uterine artery embolization, 1 vial of of 500 - 722micron Embospheres were injected into the left uterine artery. For right uterine artery embolization, 2.75 vials of 500-700 micron embospheres were injected. Post embolization angiograms confirmed stasis within both uterine artery vascular territories. C2 catheters and micro catheters removed bilaterally. Injection of the common femoral arteries bilaterally confirms position within the common femoral vasculature and safe for closure device. Hemostasis obtained with the Celt device bilaterally. The patient tolerated the procedure well. No immediate complication. IMPRESSION: Successful bilateral uterine artery embolization  (U F E) Electronically Signed   By: Jerilynn Mages.  Shick M.D.   On: 12/31/2021 16:02    Treatments: observation; pain control   Discharge Exam: Blood pressure 120/69, pulse (!) 105, temperature 97.7 F (36.5 C), temperature source Oral, resp. rate 18, height 5\' 5"  (1.651 m), weight 160 lb (72.6 kg), last menstrual period 12/30/2021, SpO2 98 %. Physical Exam Constitutional:      General: She is not in acute distress.    Appearance: She is not ill-appearing.  Cardiovascular:     Comments: Bilateral femoral artery sites are clean, soft and dry. Moderate tenderness to palpation.  Pulmonary:     Effort: Pulmonary effort is normal.  Skin:    General: Skin is warm and dry.  Neurological:     Mental Status: She is alert and oriented to person, place, and time.    Disposition: Discharge disposition: 01-Home or Self Care        Allergies as of 01/01/2022       Reactions   Hydrocodone-acetaminophen Other (See Comments)   Light headed   Other Other (See Comments)   [place] - stinging mouth sores        Medication List     TAKE these medications    ALPRAZolam 1 MG tablet Commonly known as: XANAX Take 1 mg by mouth at bedtime.   amphetamine-dextroamphetamine 20 MG tablet Commonly known as: ADDERALL Take 20 mg by mouth every morning.   Benefiber Powd Take 2 Scoops by mouth daily. Mixed in with coffee   buPROPion 300 MG 24 hr tablet Commonly known as: WELLBUTRIN XL Take 300 mg by mouth every morning.   FLUoxetine  20 MG capsule Commonly known as: PROZAC Take 20 mg by mouth every morning.   Hailey 24 Fe 1-20 MG-MCG(24) tablet Generic drug: Norethindrone Acetate-Ethinyl Estrad-FE Take 1 tablet by mouth daily.   HYDROcodone-acetaminophen 5-325 MG tablet Commonly known as: NORCO/VICODIN Take 1-2 tablets by mouth every 6 (six) hours as needed for up to 5 days for moderate pain or severe pain.   hydrocortisone cream 0.5 % Apply 1 application topically as needed for itching. To  heat rash on hips   lamoTRIgine 150 MG tablet Commonly known as: LAMICTAL Take 150 mg by mouth at bedtime.   medroxyPROGESTERone 150 MG/ML injection Commonly known as: DEPO-PROVERA Inject 1 mL into the muscle every 3 (three) months.   multivitamin with minerals Tabs tablet Take 1 tablet by mouth daily.   ondansetron 4 MG tablet Commonly known as: ZOFRAN Take 1 tablet (4 mg total) by mouth every 8 (eight) hours as needed for up to 7 days for nausea or vomiting.   QUEtiapine 300 MG 24 hr tablet Commonly known as: SEROQUEL XR Take 300 mg by mouth at bedtime.   SUMAtriptan 25 MG tablet Commonly known as: IMITREX Take 25 mg by mouth See admin instructions. Take 25 mg at onset of migraine.  May repeat in 2 hours if needed.        Follow-up Information     [place] Follow up.   Why: Please follow up with Dr. Annamaria Boots in one month. A scheduler from our office will call you with a date/time of your appointment. Please call our office with any questions/concerns prior to your visit. Contact information: [place] [address] [phone]                  Electronically Signed: Theresa Duty, NP 01/01/2022, 11:05 AM   I have spent Less Than 30 Minutes discharging Janith Lima.

## 2022-02-04 ENCOUNTER — Ambulatory Visit
Admission: RE | Admit: 2022-02-04 | Discharge: 2022-02-04 | Disposition: A | Discharge status: Home / Self Care | Payer: Managed Care, Other (non HMO) | Source: Ambulatory Visit | Attending: Student | Admitting: Student

## 2022-02-04 ENCOUNTER — Other Ambulatory Visit: Payer: Self-pay

## 2022-02-04 DIAGNOSIS — D259 Leiomyoma of uterus, unspecified: Secondary | ICD-10-CM

## 2022-02-04 NOTE — Progress Notes (Signed)
Patient ID: Lydia [name], female   DOB: 1976/06/30, 46 y.o.   MRN: [id number] ?    ? ? ?Chief Complaint: ? ?Uterine fibroids with dysfunctional uterine bleeding  ? ?Referring Physician(s): ? ?Dr. Garwin Brothers ? ?History of Present Illness: ?Lydia [name] is a 46 y.o. female who is now 1 month status post uterine fibroid embolization.  Procedure performed 12/31/2021 at [facility].  Patient presented with dysfunctional uterine bleeding.  She described very heavy periods with passage of blood clots.  Cycle last approximately 5 days with 4 heavy days.  Because of the heavy bleeding she was put on birth control pills and Depo-Provera shots every 3 months.  Despite this she had breakthrough vaginal bleeding. ? ?Now she has recovered following the procedure 1 month ago.  She is doing well at home.  No recent illness or fevers.  No abnormal vaginal discharge.  She states she is having early vaginal spotting and expects her period to start any day.  Overall she is fully recovered.  She is not requiring any oral pain medication or anti-inflammatories anymore.  No new imaging at this point. ? ?Past Medical History:  ?Diagnosis Date  ? Abnormal uterine bleeding (AUB)   ? ADHD   ? Bipolar disorder ([place])   ? GERD (gastroesophageal reflux disease)   ? Headache   ? migraines  ? Heat rash   ? on hips x 1 week using hydrocortisone cream and ice pack prn  ? OCD (obsessive compulsive disorder)   ? ? ?Past Surgical History:  ?Procedure Laterality Date  ? DILATATION & CURETTAGE/HYSTEROSCOPY WITH MYOSURE N/A 03/28/2020  ? Procedure: DILATATION & CURETTAGE/HYSTEROSCOPY WITH DISSECTION OF SUB MUCOSAL FIBROID,WITH MYOSURE XL, AND ENDOCERVICAL AND ENDOMETRIAL POLYPECTOMY WITH MYOSURE XL;  Surgeon: Servando Salina, MD;  Location: [facility];  Service: Gynecology;  Laterality: N/A;  ? HEMORRHOID SURGERY    ? IR ANGIOGRAM PELVIS SELECTIVE OR SUPRASELECTIVE  12/31/2021  ? IR ANGIOGRAM SELECTIVE EACH ADDITIONAL VESSEL  12/31/2021  ? IR  ANGIOGRAM SELECTIVE EACH ADDITIONAL VESSEL  12/31/2021  ? IR EMBO TUMOR ORGAN ISCHEMIA INFARCT INC GUIDE ROADMAPPING  12/31/2021  ? IR RADIOLOGIST EVAL & MGMT  10/14/2021  ? IR US GUIDE VASC ACCESS LEFT  12/31/2021  ? IR US GUIDE VASC ACCESS RIGHT  12/31/2021  ? LAPAROSCOPIC APPENDECTOMY N/A 12/23/2015  ? Procedure: APPENDECTOMY LAPAROSCOPIC;  Surgeon: Alphonsa Overall, MD;  Location: [facility];  Service: General;  Laterality: N/A;  ? ? ?Allergies: ?Hydrocodone-acetaminophen and Other ? ?Medications: ?Prior to Admission medications   ?Medication Sig Start Date End Date Taking? Authorizing Provider  ?ALPRAZolam (XANAX) 1 MG tablet Take 1 mg by mouth at bedtime. 12/29/19   [provider]  ?amphetamine-dextroamphetamine (ADDERALL) 20 MG tablet Take 20 mg by mouth every morning.  12/11/15   [provider]  ?buPROPion (WELLBUTRIN XL) 300 MG 24 hr tablet Take 300 mg by mouth every morning.  09/17/15   [provider]  ?FLUoxetine (PROZAC) 20 MG capsule Take 20 mg by mouth every morning.  09/17/15   [provider]  ?HAILEY 24 FE 1-20 MG-MCG(24) tablet Take 1 tablet by mouth daily. 11/29/21   [provider]  ?hydrocortisone cream 0.5 % Apply 1 application topically as needed for itching. To heat rash on hips    [provider]  ?lamoTRIgine (LAMICTAL) 150 MG tablet Take 150 mg by mouth at bedtime. 11/17/19   [provider]  ?medroxyPROGESTERone (DEPO-PROVERA) 150 MG/ML injection Inject 1 mL into the muscle  every 3 (three) months. 11/26/21   [provider]  ?Multiple Vitamin (MULTIVITAMIN WITH MINERALS) TABS tablet Take 1 tablet by mouth daily.    [provider]  ?QUEtiapine (SEROQUEL XR) 300 MG 24 hr tablet Take 300 mg by mouth at bedtime.  11/13/15   [provider]  ?SUMAtriptan (IMITREX) 25 MG tablet Take 25 mg by mouth See admin instructions. Take 25 mg at onset of migraine.  May repeat in 2 hours if needed. 12/19/15   [provider]   ?Wheat Dextrin (BENEFIBER) POWD Take 2 Scoops by mouth daily. Mixed in with coffee    [provider]  ?  ? ?No family history on file. ? ?Social History  ? ?Socioeconomic History  ? Marital status: Married  ?  Spouse name: Not on file  ? Number of children: Not on file  ? Years of education: Not on file  ? Highest education level: Not on file  ?Occupational History  ? Not on file  ?Tobacco Use  ? Smoking status: Former  ?  Packs/day: 0.25  ?  Years: 10.00  ?  Pack years: 2.50  ?  Types: Cigarettes  ?  Quit date: 12/22/1998  ?  Years since quitting: 23.1  ? Smokeless tobacco: Former  ?  Types: Snuff  ?  Quit date: 12/23/1995  ?Vaping Use  ? Vaping Use: Never used  ?Substance and Sexual Activity  ? Alcohol use: Yes  ?  Comment: very rare  ? Drug use: No  ? Sexual activity: Not on file  ?Other Topics Concern  ? Not on file  ?Social History Narrative  ? Not on file  ? ?Social Determinants of Health  ? ?Financial Resource Strain: Not on file  ?Food Insecurity: Not on file  ?Transportation Needs: Not on file  ?Physical Activity: Not on file  ?Stress: Not on file  ?Social Connections: Not on file  ? ? ?Review of Systems ? ?Review of Systems: A 12 point ROS discussed and pertinent positives are indicated in the HPI above.  All other systems are negative. ? ?Physical Exam ?No direct physical exam was performed, telephone health visit only today ?Vital Signs: ?There were no vitals taken for this visit. ? ?Imaging: ?No results found. ? ?Labs: ? ?CBC: ?Recent Labs  ?  12/31/21 ?1150  ?WBC 8.0  ?HGB 13.1  ?HCT 39.8  ?PLT 379  ? ? ?COAGS: ?Recent Labs  ?  12/31/21 ?1150  ?INR 0.9  ? ? ?BMP: ?Recent Labs  ?  12/31/21 ?1150  ?NA 135  ?K 3.4*  ?CL 106  ?CO2 20*  ?GLUCOSE 105*  ?BUN 11  ?CALCIUM 8.9  ?CREATININE 1.19*  ?GFRNONAA 57*  ? ? ?LIVER FUNCTION TESTS: ?No results for input(s): BILITOT, AST, ALT, ALKPHOS, PROT, ALBUMIN in the last 8760 hours. ?Assessment and Plan: ? ?1 month status post uterine fibroid embolization  for uterine fibroids and dysfunctional uterine bleeding.  She is doing very well at home over the last month and fully recovered from the procedure.  No current abdominal pelvic pain.  No abnormal discharge.  She states that she is due to start her next period any day now.  She has GYN follow-up with Dr. Garwin Brothers next week. ? ?Plan: Scheduled for posttreatment pelvic MRI with contrast in 5 months along with an outpatient office visit.  She has our contact information for any concerns or issues until then. ? ? ?Electronically Signed: ?Greggory Keen ?02/04/2022, 3:08 PM ? ? ?I spent a total  of    15 Minutes in remote  clinical consultation, greater than 50% of which was counseling/coordinating care for this patient status post UFE.   ? ?Visit type: Audio only (telephone). Audio (no video) only due to patient's lack of internet/smartphone capability. ?Alternative for in-person consultation at [facility], [address] [place], [place], [place]. ?This visit type was conducted due to national recommendations for restrictions regarding the COVID-19 Pandemic (e.g. social distancing).  This format is felt to be most appropriate for this patient at this time.  All issues noted in this document were discussed and addressed.   ? ?

## 2022-06-17 ENCOUNTER — Other Ambulatory Visit: Payer: Self-pay | Admitting: Interventional Radiology

## 2022-06-17 DIAGNOSIS — D259 Leiomyoma of uterus, unspecified: Secondary | ICD-10-CM

## 2022-06-30 ENCOUNTER — Other Ambulatory Visit: Payer: Self-pay | Admitting: Interventional Radiology

## 2022-06-30 DIAGNOSIS — D259 Leiomyoma of uterus, unspecified: Secondary | ICD-10-CM

## 2022-10-09 ENCOUNTER — Ambulatory Visit (HOSPITAL_BASED_OUTPATIENT_CLINIC_OR_DEPARTMENT_OTHER)
Admission: RE | Admit: 2022-10-09 | Discharge: 2022-10-09 | Disposition: A | Discharge status: Home / Self Care | Payer: Managed Care, Other (non HMO) | Source: Ambulatory Visit | Attending: Interventional Radiology | Admitting: Interventional Radiology

## 2022-10-09 DIAGNOSIS — D250 Submucous leiomyoma of uterus: Secondary | ICD-10-CM | POA: Insufficient documentation

## 2022-10-09 MED ORDER — GADOBUTROL 1 MMOL/ML IV SOLN
7.2000 mL | Freq: Once | INTRAVENOUS | Status: AC | PRN
  Administered 2022-10-09: 7.2 mL via INTRAVENOUS
  Filled 2022-10-09: qty 7.5

## 2022-10-20 ENCOUNTER — Ambulatory Visit
Admission: RE | Admit: 2022-10-20 | Discharge: 2022-10-20 | Disposition: A | Discharge status: Home / Self Care | Payer: Managed Care, Other (non HMO) | Source: Ambulatory Visit | Attending: Interventional Radiology | Admitting: Interventional Radiology

## 2022-10-20 DIAGNOSIS — D259 Leiomyoma of uterus, unspecified: Secondary | ICD-10-CM

## 2022-10-20 NOTE — Progress Notes (Signed)
Patient ID: Lydia [name], female   DOB: 10-Feb-1976, 46 y.o.   MRN: [id number]       Chief Complaint:   Symptomatic uterine fibroids, dysfunctional uterine bleeding  Referring Physician(s): Adely Facer  History of Present Illness: Lydia [name] is a 46 y.o. female with a prior history of known uterine fibroids and dysfunctional uterine bleeding.  She was experiencing heavy periods with passage of blood clots.  Menstrual cycle was lasting approximately 5 days with 4 heavy days.  She also had breakthrough bleeding despite hormonal therapies.  History of prior fibroid surgery in 2021.  She is now approximately 9 months status post successful uterine fibroid embolization.  Procedure performed 12/31/2021.  She now has a very light cycle with approximately 1 day of minimal if any bleeding.  No residual pelvic pain, cramping or discomfort.  Essentially she is asymptomatic.  Posttreatment MRI at 9 months demonstrates significant reduction in the overall uterine volume and the small scattered subcentimeter fibroids are avascular.  No other significant finding by MRI.  These findings are correlating with her clinical outcome.  Past Medical History:  Diagnosis Date   Abnormal uterine bleeding (AUB)    ADHD    Bipolar disorder ([facility])    GERD (gastroesophageal reflux disease)    Headache    migraines   Heat rash    on hips x 1 week using hydrocortisone cream and ice pack prn   OCD (obsessive compulsive disorder)     Past Surgical History:  Procedure Laterality Date   DILATATION & CURETTAGE/HYSTEROSCOPY WITH MYOSURE N/A 03/28/2020   Procedure: DILATATION & CURETTAGE/HYSTEROSCOPY WITH DISSECTION OF SUB MUCOSAL FIBROID,WITH MYOSURE XL, AND ENDOCERVICAL AND ENDOMETRIAL POLYPECTOMY WITH MYOSURE XL;  Surgeon: Servando Salina, MD;  Location: [facility];  Service: Gynecology;  Laterality: N/A;   HEMORRHOID SURGERY     IR ANGIOGRAM PELVIS SELECTIVE OR SUPRASELECTIVE  12/31/2021   IR  ANGIOGRAM SELECTIVE EACH ADDITIONAL VESSEL  12/31/2021   IR ANGIOGRAM SELECTIVE EACH ADDITIONAL VESSEL  12/31/2021   IR EMBO TUMOR ORGAN ISCHEMIA INFARCT INC GUIDE ROADMAPPING  12/31/2021   IR RADIOLOGIST EVAL & MGMT  10/14/2021   IR US GUIDE VASC ACCESS LEFT  12/31/2021   IR US GUIDE VASC ACCESS RIGHT  12/31/2021   LAPAROSCOPIC APPENDECTOMY N/A 12/23/2015   Procedure: APPENDECTOMY LAPAROSCOPIC;  Surgeon: Alphonsa Overall, MD;  Location[facility];  Service: General;  Laterality: N/A;    Allergies: Hydrocodone-acetaminophen and Other  Medications: Prior to Admission medications   Medication Sig Start Date End Date Taking? Authorizing Provider  ALPRAZolam Duanne Moron) 1 MG tablet Take 1 mg by mouth at bedtime. 12/29/19   [provider]  amphetamine-dextroamphetamine (ADDERALL) 20 MG tablet Take 20 mg by mouth every morning.  12/11/15   [provider]  buPROPion (WELLBUTRIN XL) 300 MG 24 hr tablet Take 300 mg by mouth every morning.  09/17/15   [provider]  FLUoxetine (PROZAC) 20 MG capsule Take 20 mg by mouth every morning.  09/17/15   [provider]  HAILEY 24 FE 1-20 MG-MCG(24) tablet Take 1 tablet by mouth daily. 11/29/21   [provider]  hydrocortisone cream 0.5 % Apply 1 application topically as needed for itching. To heat rash on hips    [provider]  lamoTRIgine (LAMICTAL) 150 MG tablet Take 150 mg by mouth at bedtime. 11/17/19   [provider]  medroxyPROGESTERone (DEPO-PROVERA) 150 MG/ML injection Inject 1 mL into the muscle every 3 (three) months. 11/26/21   [provider]  Multiple Vitamin (MULTIVITAMIN WITH MINERALS) TABS tablet Take 1 tablet by mouth daily.    [provider]  QUEtiapine (SEROQUEL XR) 300 MG 24 hr tablet Take 300 mg by mouth at bedtime.  11/13/15   [provider]  SUMAtriptan (IMITREX) 25 MG tablet Take 25 mg by mouth See admin instructions. Take 25 mg at onset of migraine.  May  repeat in 2 hours if needed. 12/19/15   [provider]  Wheat Dextrin (BENEFIBER) POWD Take 2 Scoops by mouth daily. Mixed in with coffee    [provider]     No family history on file.  Social History   Socioeconomic History   Marital status: Married    Spouse name: Not on file   Number of children: Not on file   Years of education: Not on file   Highest education level: Not on file  Occupational History   Not on file  Tobacco Use   Smoking status: Former    Packs/day: 0.25    Years: 10.00    Total pack years: 2.50    Types: Cigarettes    Quit date: 12/22/1998    Years since quitting: 23.8   Smokeless tobacco: Former    Types: Snuff    Quit date: 12/23/1995  Vaping Use   Vaping Use: Never used  Substance and Sexual Activity   Alcohol use: Yes    Comment: very rare   Drug use: No   Sexual activity: Not on file  Other Topics Concern   Not on file  Social History Narrative   Not on file   Social Determinants of Health   Financial Resource Strain: Not on file  Food Insecurity: Not on file  Transportation Needs: Not on file  Physical Activity: Not on file  Stress: Not on file  Social Connections: Not on file       Review of Systems  Review of Systems: A 12 point ROS discussed and pertinent positives are indicated in the HPI above.  All other systems are negative.    Physical Exam No direct physical exam was performed Telehealth visit only today to review surveillance imaging Vital Signs: There were no vitals taken for this visit.  Imaging: MR PELVIS W WO CONTRAST  Result Date: 10/12/2022 CLINICAL DATA:  Follow-up uterine fibroids. 9 months status post uterine artery embolization. EXAM: MRI PELVIS WITHOUT AND WITH CONTRAST TECHNIQUE: Multiplanar multisequence MR imaging of the pelvis was performed both before and after administration of intravenous contrast. CONTRAST:  7.25m GADAVIST GADOBUTROL 1 MMOL/ML IV SOLN [place]  [place] T[place]: No urinary bladder or urethral abnormality identified. Bowel: Unremarkable pelvic bowel loops. Vascular/Lymphatic: Unremarkable. No pathologically enlarged pelvic lymph nodes identified. Reproductive: -- Uterus: Measures 7.5 x 3.4 x 4.2 cm (volume = 56 cm^3). This has significantly decreased in volume from 136cc on prior exam. Several tiny less than 1 cm residual fibroids are seen, also decreased from up to 1.5 cm on prior exam. No evidence of abnormal endometrial thickening. Small cervical nabothian cyst again noted. -- Right ovary: Appears normal. No ovarian or adnexal masses identified. -- Left ovary: Appears normal. No ovarian or adnexal masses identified. Other: No peritoneal thickening or abnormal free fluid. Musculoskeletal:  Unremarkable. IMPRESSION: Significant decrease in overall uterine volume and size of several sub-centimeter fibroids. Normal appearance of both ovaries. No adnexal mass identified. Electronically Signed   By: JMarlaine HindM.D.   On: 10/12/2022 14:28  Labs:  CBC: Recent Labs    12/31/21 1150  WBC 8.0  HGB 13.1  HCT 39.8  PLT 379    COAGS: Recent Labs    12/31/21 1150  INR 0.9    BMP: Recent Labs    12/31/21 1150  NA 135  K 3.4*  CL 106  CO2 20*  GLUCOSE 105*  BUN 11  CALCIUM 8.9  CREATININE 1.19*  GFRNONAA 57*    LIVER FUNCTION TESTS: No results for input(s): "BILITOT", "AST", "ALT", "ALKPHOS", "PROT", "ALBUMIN" in the last 8760 hours.     Assessment and Plan:  37-monthstatus post successful uterine fibroid embolization for symptomatic small uterine fibroids with dysfunctional uterine bleeding.  Overall she has done very well following the treatment.  Very minor periods at all now that last approximately 1 day with minor spotting.  No interperiod Bleeding.  No residual pelvic pain, cramping or discomfort.  Essentially she is asymptomatic.  Plan: She has routine follow-up with Dr. CGarwin Brothersher  GYN.  Follow-up with interventional as needed.   Thank you for this interesting consult.  I greatly enjoyed meeting Lydia Mcdonnelland look forward to participating in their care.  A copy of this report was sent to the requesting provider on this date.  Electronically Signed: MGreggory Keen11/21/2023, 4:28 PM   I spent a total of    25 Minutes in remote  clinical consultation, greater than 50% of which was counseling/coordinating care for This patient status post uterine fibroid embolization.    Visit type: Audio only (telephone). Audio (no video) only due to patient's lack of internet/smartphone capability. [facility] [address] [place] [place] This visit type was conducted due to national recommendations for restrictions regarding the COVID-19 Pandemic (e.g. social distancing).  This format is felt to be most appropriate for this patient at this time.  All issues noted in this document were discussed and addressed.

## 2022-12-09 ENCOUNTER — Other Ambulatory Visit (HOSPITAL_COMMUNITY): Payer: Self-pay | Admitting: Registered Nurse

## 2022-12-09 DIAGNOSIS — Z8249 Family history of ischemic heart disease and other diseases of the circulatory system: Secondary | ICD-10-CM

## 2023-01-04 ENCOUNTER — Other Ambulatory Visit: Payer: Self-pay | Admitting: Internal Medicine

## 2023-01-04 DIAGNOSIS — N63 Unspecified lump in unspecified breast: Secondary | ICD-10-CM

## 2023-01-25 ENCOUNTER — Ambulatory Visit
Admission: RE | Admit: 2023-01-25 | Discharge: 2023-01-25 | Disposition: A | Discharge status: Home / Self Care | Payer: Managed Care, Other (non HMO) | Source: Ambulatory Visit | Attending: Internal Medicine | Admitting: Internal Medicine

## 2023-01-25 DIAGNOSIS — N63 Unspecified lump in unspecified breast: Secondary | ICD-10-CM

## 2024-03-08 ENCOUNTER — Other Ambulatory Visit: Payer: Self-pay | Admitting: Internal Medicine

## 2024-03-08 DIAGNOSIS — Z1231 Encounter for screening mammogram for malignant neoplasm of breast: Secondary | ICD-10-CM

## 2024-03-24 ENCOUNTER — Ambulatory Visit
Admission: RE | Admit: 2024-03-24 | Discharge: 2024-03-24 | Disposition: A | Discharge status: Home / Self Care | Source: Ambulatory Visit | Attending: Internal Medicine | Admitting: Internal Medicine

## 2024-03-24 DIAGNOSIS — Z1231 Encounter for screening mammogram for malignant neoplasm of breast: Secondary | ICD-10-CM
